# Patient Record
Sex: Male | Born: 1945 | Race: White | Hispanic: No | Marital: Married | Smoking: Former smoker
Health system: Southern US, Community
[De-identification: ages and names within clinical notes are randomized; demographics above are authoritative.]

## PROBLEM LIST (undated history)

## (undated) DIAGNOSIS — E119 Type 2 diabetes mellitus without complications: Secondary | ICD-10-CM

## (undated) DIAGNOSIS — F329 Major depressive disorder, single episode, unspecified: Secondary | ICD-10-CM

## (undated) DIAGNOSIS — R51 Headache: Secondary | ICD-10-CM

## (undated) DIAGNOSIS — K219 Gastro-esophageal reflux disease without esophagitis: Secondary | ICD-10-CM

## (undated) DIAGNOSIS — N529 Male erectile dysfunction, unspecified: Secondary | ICD-10-CM

## (undated) DIAGNOSIS — I2510 Atherosclerotic heart disease of native coronary artery without angina pectoris: Secondary | ICD-10-CM

## (undated) DIAGNOSIS — M47812 Spondylosis without myelopathy or radiculopathy, cervical region: Secondary | ICD-10-CM

## (undated) DIAGNOSIS — F32A Depression, unspecified: Secondary | ICD-10-CM

## (undated) DIAGNOSIS — E039 Hypothyroidism, unspecified: Secondary | ICD-10-CM

## (undated) DIAGNOSIS — E669 Obesity, unspecified: Secondary | ICD-10-CM

## (undated) DIAGNOSIS — G4733 Obstructive sleep apnea (adult) (pediatric): Secondary | ICD-10-CM

## (undated) DIAGNOSIS — G5600 Carpal tunnel syndrome, unspecified upper limb: Secondary | ICD-10-CM

## (undated) DIAGNOSIS — E785 Hyperlipidemia, unspecified: Secondary | ICD-10-CM

## (undated) HISTORY — DX: Hyperlipidemia, unspecified: E78.5

## (undated) HISTORY — DX: Carpal tunnel syndrome, unspecified upper limb: G56.00

## (undated) HISTORY — DX: Major depressive disorder, single episode, unspecified: F32.9

## (undated) HISTORY — DX: Spondylosis without myelopathy or radiculopathy, cervical region: M47.812

## (undated) HISTORY — DX: Atherosclerotic heart disease of native coronary artery without angina pectoris: I25.10

## (undated) HISTORY — PX: CATARACT EXTRACTION: SUR2

## (undated) HISTORY — DX: Obstructive sleep apnea (adult) (pediatric): G47.33

## (undated) HISTORY — DX: Gastro-esophageal reflux disease without esophagitis: K21.9

## (undated) HISTORY — DX: Male erectile dysfunction, unspecified: N52.9

## (undated) HISTORY — PX: CORONARY STENT PLACEMENT: SHX1402

## (undated) HISTORY — DX: Hypothyroidism, unspecified: E03.9

## (undated) HISTORY — DX: Obesity, unspecified: E66.9

## (undated) HISTORY — PX: LIPOMA RESECTION: SHX23

## (undated) HISTORY — PX: INGUINAL HERNIA REPAIR: SUR1180

## (undated) HISTORY — DX: Type 2 diabetes mellitus without complications: E11.9

## (undated) HISTORY — DX: Headache: R51

## (undated) HISTORY — PX: PENILE PROSTHESIS IMPLANT: SHX240

## (undated) HISTORY — DX: Depression, unspecified: F32.A

---

## 1998-05-19 ENCOUNTER — Ambulatory Visit (HOSPITAL_BASED_OUTPATIENT_CLINIC_OR_DEPARTMENT_OTHER): Admission: RE | Admit: 1998-05-19 | Discharge: 1998-05-19 | Payer: Self-pay | Admitting: Orthopedic Surgery

## 2003-07-29 ENCOUNTER — Ambulatory Visit (HOSPITAL_COMMUNITY): Admission: RE | Admit: 2003-07-29 | Discharge: 2003-07-29 | Payer: Self-pay | Admitting: Gastroenterology

## 2003-07-29 ENCOUNTER — Encounter (INDEPENDENT_AMBULATORY_CARE_PROVIDER_SITE_OTHER): Payer: Self-pay | Admitting: Specialist

## 2003-09-25 ENCOUNTER — Encounter: Admission: RE | Admit: 2003-09-25 | Discharge: 2003-12-24 | Payer: Self-pay | Admitting: Internal Medicine

## 2006-02-28 ENCOUNTER — Encounter: Admission: RE | Admit: 2006-02-28 | Discharge: 2006-02-28 | Payer: Self-pay | Admitting: Internal Medicine

## 2006-03-07 ENCOUNTER — Other Ambulatory Visit: Admission: RE | Admit: 2006-03-07 | Discharge: 2006-03-07 | Payer: Self-pay | Admitting: Interventional Radiology

## 2006-03-07 ENCOUNTER — Encounter (INDEPENDENT_AMBULATORY_CARE_PROVIDER_SITE_OTHER): Payer: Self-pay | Admitting: Specialist

## 2006-03-07 ENCOUNTER — Encounter: Admission: RE | Admit: 2006-03-07 | Discharge: 2006-03-07 | Payer: Self-pay | Admitting: Internal Medicine

## 2006-04-24 ENCOUNTER — Encounter (INDEPENDENT_AMBULATORY_CARE_PROVIDER_SITE_OTHER): Payer: Self-pay | Admitting: *Deleted

## 2006-04-24 ENCOUNTER — Ambulatory Visit (HOSPITAL_COMMUNITY): Admission: RE | Admit: 2006-04-24 | Discharge: 2006-04-25 | Payer: Self-pay | Admitting: General Surgery

## 2006-06-01 ENCOUNTER — Encounter: Admission: RE | Admit: 2006-06-01 | Discharge: 2006-06-01 | Payer: Self-pay | Admitting: Internal Medicine

## 2006-07-18 ENCOUNTER — Encounter: Admission: RE | Admit: 2006-07-18 | Discharge: 2006-07-18 | Payer: Self-pay | Admitting: Internal Medicine

## 2006-09-04 IMAGING — CR DG CHEST 2V
2 series · 2 of 2 positions shown · non-contrast
Comparison: none

CLINICAL DATA: Thyroid nodule.  Preoperative respiratory evaluation.  
 CHEST - 2 VIEW:

[view not recorded (1 of 2)]
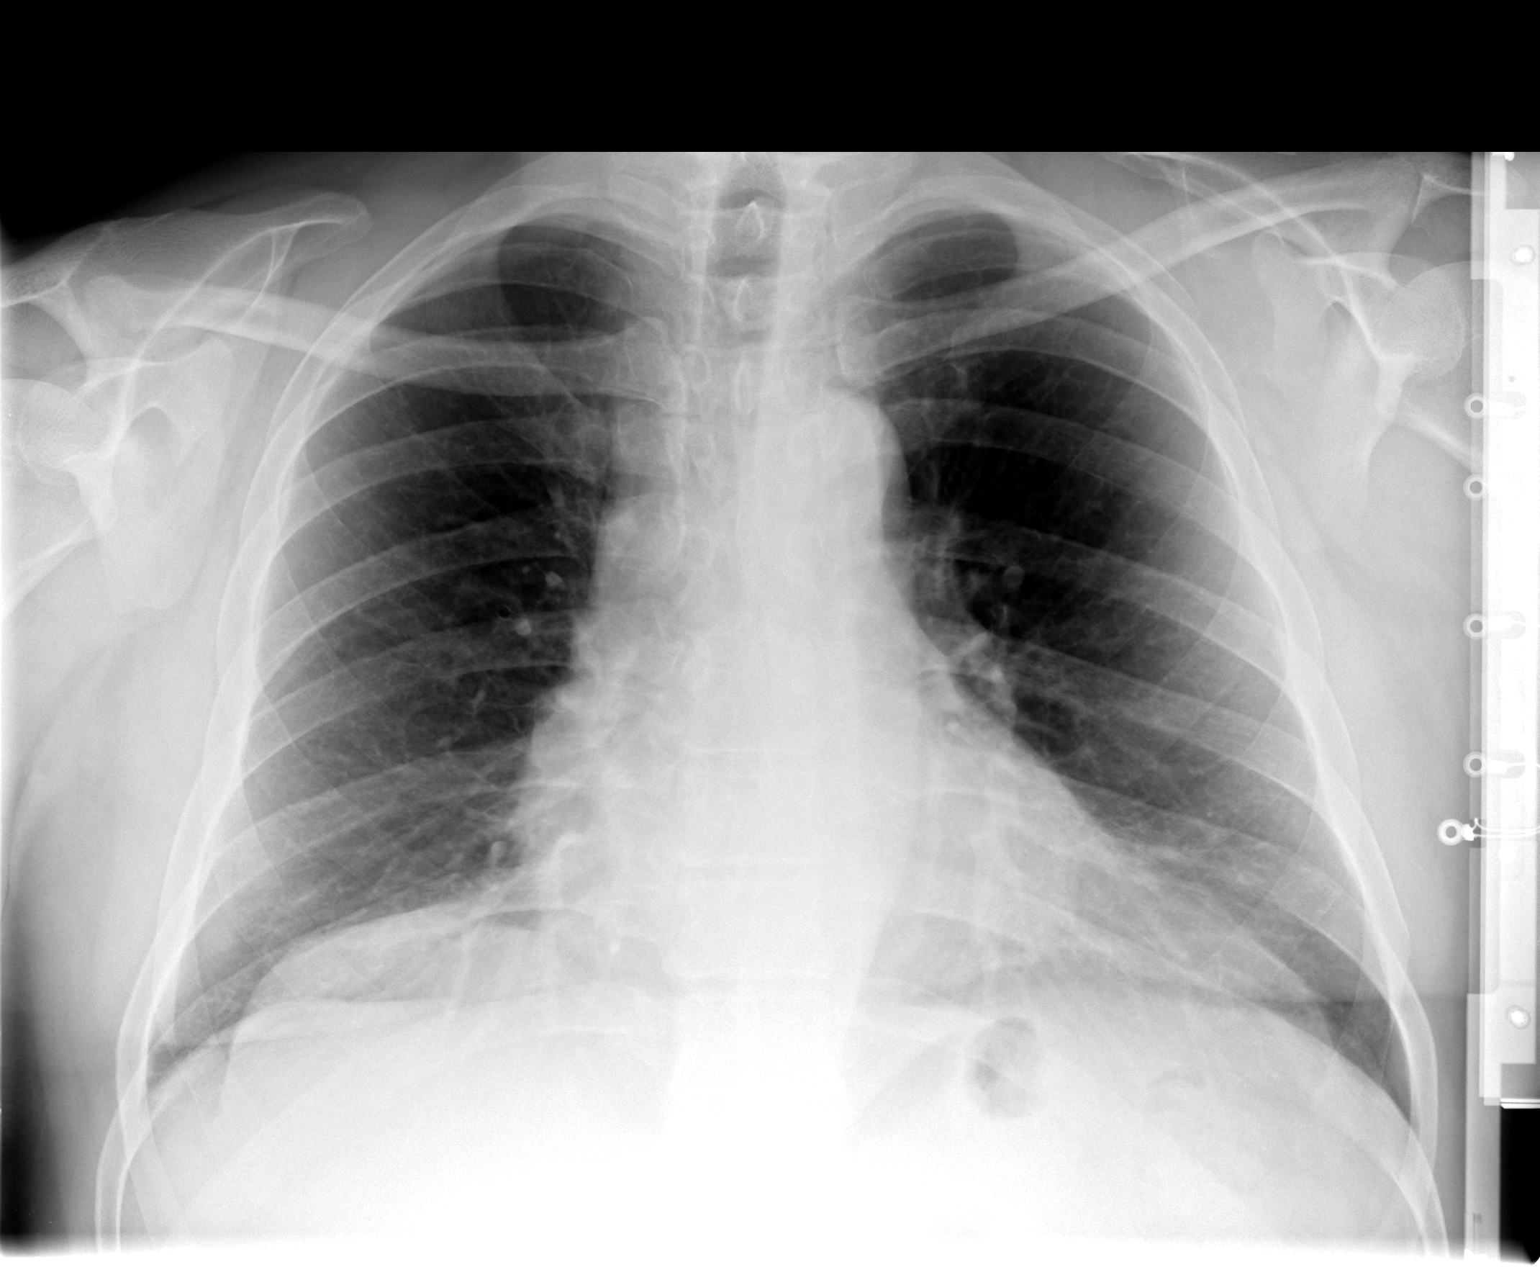

[view not recorded (2 of 2)]
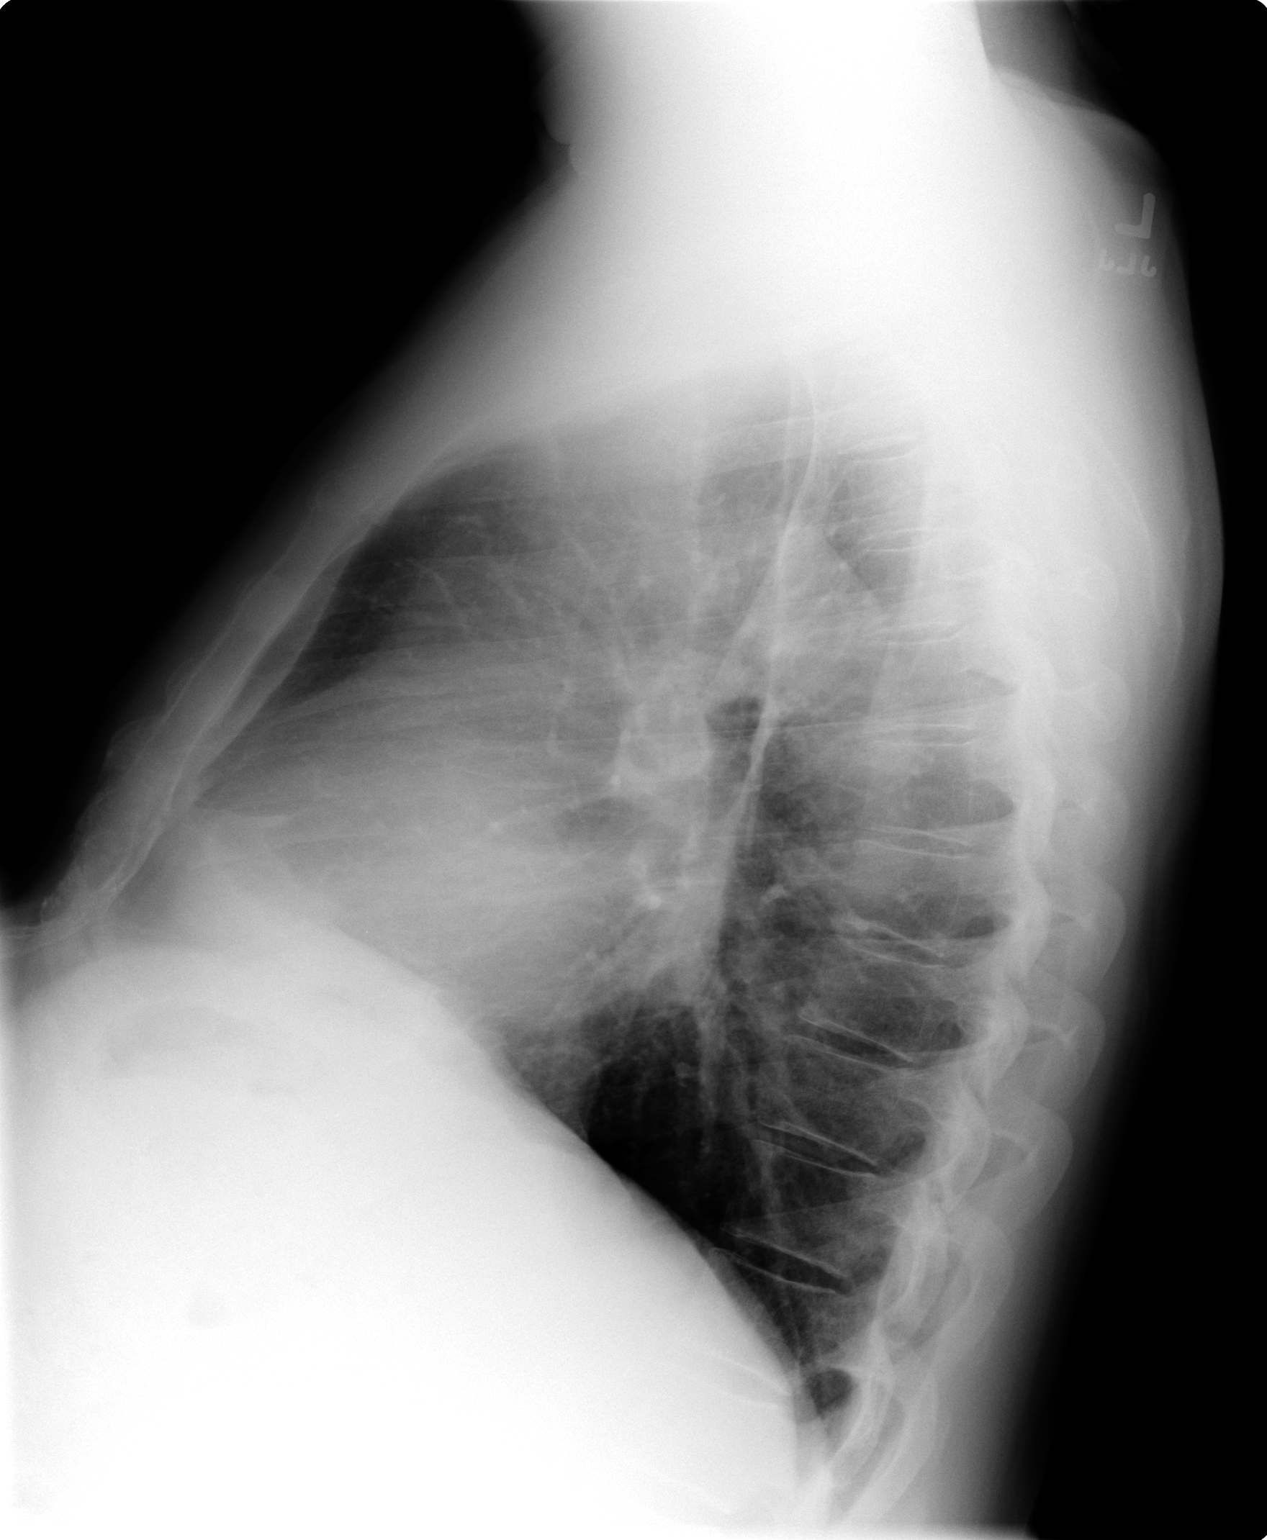

[2 of 2 positions shown; findings below may reference images not displayed]

FINDINGS: The heart and mediastinum are normal.  The lungs are clear.  No effusions.  No soft tissue or bony abnormalities.
IMPRESSION: Normal chest.

## 2006-12-22 ENCOUNTER — Encounter: Admission: RE | Admit: 2006-12-22 | Discharge: 2006-12-22 | Payer: Self-pay | Admitting: Internal Medicine

## 2007-01-09 ENCOUNTER — Encounter: Admission: RE | Admit: 2007-01-09 | Discharge: 2007-01-09 | Payer: Self-pay | Admitting: Internal Medicine

## 2007-03-20 ENCOUNTER — Encounter: Admission: RE | Admit: 2007-03-20 | Discharge: 2007-03-20 | Payer: Self-pay | Admitting: Internal Medicine

## 2008-12-22 ENCOUNTER — Ambulatory Visit (HOSPITAL_COMMUNITY): Admission: RE | Admit: 2008-12-22 | Discharge: 2008-12-22 | Payer: Self-pay | Admitting: General Surgery

## 2010-01-11 ENCOUNTER — Encounter: Admission: RE | Admit: 2010-01-11 | Discharge: 2010-02-01 | Payer: Self-pay | Admitting: Otolaryngology

## 2010-03-05 ENCOUNTER — Ambulatory Visit (HOSPITAL_BASED_OUTPATIENT_CLINIC_OR_DEPARTMENT_OTHER): Admission: RE | Admit: 2010-03-05 | Discharge: 2010-03-05 | Payer: Self-pay | Admitting: Urology

## 2010-09-02 ENCOUNTER — Ambulatory Visit: Payer: Self-pay | Admitting: Vascular Surgery

## 2010-09-30 ENCOUNTER — Ambulatory Visit
Admission: RE | Admit: 2010-09-30 | Discharge: 2010-09-30 | Payer: Self-pay | Source: Home / Self Care | Attending: Vascular Surgery | Admitting: Vascular Surgery

## 2010-12-08 ENCOUNTER — Other Ambulatory Visit: Payer: Self-pay | Admitting: Internal Medicine

## 2010-12-08 DIAGNOSIS — C73 Malignant neoplasm of thyroid gland: Secondary | ICD-10-CM

## 2010-12-13 LAB — POCT I-STAT 4, (NA,K, GLUC, HGB,HCT)
Glucose, Bld: 158 mg/dL — ABNORMAL HIGH (ref 70–99)
HCT: 44 % (ref 39.0–52.0)
Hemoglobin: 15 g/dL (ref 13.0–17.0)
Potassium: 3.7 mEq/L (ref 3.5–5.1)
Sodium: 141 mEq/L (ref 135–145)

## 2010-12-13 LAB — GLUCOSE, CAPILLARY: Glucose-Capillary: 129 mg/dL — ABNORMAL HIGH (ref 70–99)

## 2010-12-20 ENCOUNTER — Encounter (HOSPITAL_COMMUNITY)
Admission: RE | Admit: 2010-12-20 | Discharge: 2010-12-20 | Disposition: A | Discharge status: Home / Self Care | Payer: 59 | Source: Ambulatory Visit | Attending: Internal Medicine | Admitting: Internal Medicine

## 2010-12-20 DIAGNOSIS — Z09 Encounter for follow-up examination after completed treatment for conditions other than malignant neoplasm: Secondary | ICD-10-CM | POA: Insufficient documentation

## 2010-12-20 DIAGNOSIS — C73 Malignant neoplasm of thyroid gland: Secondary | ICD-10-CM

## 2010-12-20 DIAGNOSIS — Z85048 Personal history of other malignant neoplasm of rectum, rectosigmoid junction, and anus: Secondary | ICD-10-CM | POA: Insufficient documentation

## 2010-12-20 DIAGNOSIS — Z85850 Personal history of malignant neoplasm of thyroid: Secondary | ICD-10-CM | POA: Insufficient documentation

## 2010-12-21 ENCOUNTER — Encounter (HOSPITAL_COMMUNITY)
Admission: RE | Admit: 2010-12-21 | Discharge: 2010-12-21 | Disposition: A | Discharge status: Home / Self Care | Payer: 59 | Source: Ambulatory Visit | Attending: Internal Medicine | Admitting: Internal Medicine

## 2010-12-21 DIAGNOSIS — Z09 Encounter for follow-up examination after completed treatment for conditions other than malignant neoplasm: Secondary | ICD-10-CM | POA: Insufficient documentation

## 2010-12-21 DIAGNOSIS — Z85850 Personal history of malignant neoplasm of thyroid: Secondary | ICD-10-CM | POA: Insufficient documentation

## 2010-12-22 ENCOUNTER — Encounter (HOSPITAL_COMMUNITY)
Admission: RE | Admit: 2010-12-22 | Discharge: 2010-12-22 | Disposition: A | Discharge status: Home / Self Care | Payer: 59 | Source: Ambulatory Visit | Attending: Internal Medicine | Admitting: Internal Medicine

## 2010-12-24 ENCOUNTER — Encounter (HOSPITAL_COMMUNITY)
Admission: RE | Admit: 2010-12-24 | Discharge: 2010-12-24 | Disposition: A | Discharge status: Home / Self Care | Payer: 59 | Source: Ambulatory Visit | Attending: Internal Medicine | Admitting: Internal Medicine

## 2010-12-24 MED ORDER — SODIUM IODIDE I 131 CAPSULE
4.0000 | Freq: Once | INTRAVENOUS | Status: AC | PRN
  Administered 2010-12-24: 4 via ORAL

## 2010-12-27 ENCOUNTER — Ambulatory Visit (HOSPITAL_COMMUNITY): Payer: Self-pay

## 2010-12-28 ENCOUNTER — Ambulatory Visit (HOSPITAL_COMMUNITY): Payer: Self-pay

## 2010-12-29 ENCOUNTER — Ambulatory Visit (HOSPITAL_COMMUNITY): Payer: Self-pay

## 2010-12-31 ENCOUNTER — Encounter (HOSPITAL_COMMUNITY): Payer: Self-pay

## 2011-01-06 LAB — COMPREHENSIVE METABOLIC PANEL
ALT: 21 U/L (ref 0–53)
AST: 25 U/L (ref 0–37)
Alkaline Phosphatase: 58 U/L (ref 39–117)
CO2: 32 mEq/L (ref 19–32)
Chloride: 101 mEq/L (ref 96–112)
Creatinine, Ser: 0.91 mg/dL (ref 0.4–1.5)
GFR calc Af Amer: >60 mL/min (ref >60)
GFR calc non Af Amer: >60 mL/min (ref >60)
Potassium: 4.1 mEq/L (ref 3.5–5.1)
Total Bilirubin: 1.5 mg/dL — ABNORMAL HIGH (ref 0.3–1.2)

## 2011-01-06 LAB — DIFFERENTIAL
Basophils Absolute: 0.1 10*3/uL (ref 0.0–0.1)
Eosinophils Absolute: 0.1 10*3/uL (ref 0.0–0.7)
Lymphocytes Relative: 26 % (ref 12–46)
Lymphs Abs: 1.5 10*3/uL (ref 0.7–4.0)
Monocytes Absolute: 0.4 10*3/uL (ref 0.1–1.0)
Neutro Abs: 3.8 10*3/uL (ref 1.7–7.7)

## 2011-01-06 LAB — CBC
MCV: 98.1 fL (ref 78.0–100.0)
RBC: 4.67 MIL/uL (ref 4.22–5.81)
WBC: 5.9 10*3/uL (ref 4.0–10.5)

## 2011-01-06 LAB — URINALYSIS, ROUTINE W REFLEX MICROSCOPIC
Glucose, UA: NEGATIVE mg/dL
Hgb urine dipstick: NEGATIVE
Specific Gravity, Urine: 1.008 (ref 1.005–1.030)
Urobilinogen, UA: 1 mg/dL (ref 0.0–1.0)

## 2011-02-08 NOTE — Assessment & Plan Note (Signed)
OFFICE VISIT   Richard Ruiz, Richard Ruiz  DOB:  April 07, 1946                                       09/30/2010  CHART#:[id number]   CHIEF COMPLAINT:  Numbness in feet.   HISTORY OF PRESENT ILLNESS:  The patient is a 65 year old male referred  by Dr. 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  The patient states that he has some numbness and tingling that occurs in  his hands and feet.  This is chronic in nature.  He does not really  describe claudication symptoms.  He has had no trouble healing up wounds  on his feet currently.   CHRONIC MEDICAL PROBLEMS:  Diabetes, hypertension, elevated cholesterol,  fibromyalgia and neuropathy.  These are all currently controlled and  followed by Dr. Donette Larry.  He has had diabetes at least since 2000.   PAST SURGICAL HISTORY:  He has had multiple implants and revisions of a  penile implant.  The first one was done in 2006, second one done in  2007.  The last one was done in 2011.   SOCIAL HISTORY:  He is retired.  He served in [name] in the [employer].  He is married.  He has 3 children, only one of which remains living.  He  is a former smoker but quit in 1971.  He does not consume alcohol  regularly.   FAMILY HISTORY:  Not remarkable for vascular disease at age less than  81.   REVIEW OF SYSTEMS:  CONSTITUTIONAL:  He is 5 feet 7, 205 pounds.  VASCULAR:  As described above.  CARDIAC:  He has some occasional chest tightness, shortness of breath  with exertion.  GI:  He has a history of reflux.  NEUROLOGIC:  He has occasional dizziness, headaches.  PULMONARY:  Bronchitis and wheezing.  ENT:  He has had some decline in his eyesight and hearing loss.  MUSCULOSKELETAL:  He has multiple-joint arthritis pain.  PSYCHIATRIC:  He has mild depression, mild anxiety and mild attention  deficit disorder.   MEDICATIONS:  Centrum, calcium, zolpidem, levothyroxine, Cymbalta,  amlodipine, simvastatin, Lyrica, vitamin D  Osteo Bi-Flex, fish oil,  clonazepam, Tylenol Arthritis and topiramate.   ALLERGIES:  He has allergies listed to no medications.   PHYSICAL EXAMINATION:  Blood pressure is 151/89 in the left arm, heart  rate is 96 and regular, respirations 18.  HEENT:  Unremarkable.  Neck  has 2+ carotid pulses without bruit.  Chest is clear to auscultation  bilaterally.  Cardiac is regular rate and rhythm without murmur.  Abdomen is slightly obese, soft, nontender, nondistended.  No masses.  Extremities:  He has 2+ femoral, 2+ dorsalis pedis pulses bilaterally.  He does have hair loss from the mid tibial area down to the foot  bilaterally.  He has decreased sensation to light touch throughout the  entire sole and dorsal aspect of the foot as well as palms of hands and  digits tips.  Skin has no open ulcers or rashes except for a right knee  abrasion which is recent.  Neurologic exam is unremarkable other than  the numbness described above on sensory exam.  Musculoskeletal exam  shows no major joint deformities.   He had bilateral ABIs performed on September 02, 2010, which were  triphasic and normal bilaterally, greater than 1.   SUMMARY:  The patient has evidence of  extremity neuropathy in his upper  and lower extremities.  He did not have evidence of large-vessel disease  but certainly could have some small-vessel disease from his diabetes  that contributes to some of his numbness and tingling, although I would  also state that his symptoms certainly could be primary neuropathic in  nature secondary to the diabetes.  He does not require any intervention  for arterial occlusive disease at this point.  He will follow up with me  on an as-needed basis.     Janetta Hora. Fields, MD  Electronically Signed   CEF/MEDQ  D:  09/30/2010  T:  10/01/2010  Job:  [id number]   cc:   Veverly Fells. Vernie Ammons, M.D.  Bennye Alm Dr. Donette Larry

## 2011-02-08 NOTE — Op Note (Signed)
NAME:  Richard Ruiz, Richard Ruiz NO.:  [id number]   MEDICAL RECORD NO.:  [id number]          PATIENT TYPE:  AMB   LOCATION:  DAY                          FACILITY:  [facility]   PHYSICIAN:  Angelia Mould. Derrell Lolling, M.D.DATE OF BIRTH:  1946-09-19   DATE OF PROCEDURE:  DATE OF DISCHARGE:                               OPERATIVE REPORT   PREOPERATIVE DIAGNOSIS:  Right inguinal hernia.   POSTOPERATIVE DIAGNOSIS:  Right inguinal hernia.   OPERATION PERFORMED:  Open repair right inguinal hernia with UltraPro  mesh.   SURGEON:  Angelia Mould. Derrell Lolling, M.D.   ASSISTANT:  Dr. Ihor Gully.   OPERATIVE INDICATIONS:  This is a 65 year old Caucasian man who has had  an enlarging right inguinal hernia for about 2 years and it is beginning  to get more painful lately.  He also has a history of penile prosthesis  and pump inserted by Dr. Logan Bores in 1996.  The reservoir is just above the  symphysis pubis and the tubing does curve around on the right side.  I  have asked Dr. Ihor Gully to scrub in with me so that we could avoid  injury to the pump mechanism and tubing.   OPERATIVE FINDINGS:  The patient had a large indirect hernia with the  hernia sac extending down part way into the scrotum.  The sac was empty,  however, and did not contain any bowel or omentum.  He also had a large  lipoma associated with indirect sac.  There was no evidence of any other  hernia.  We could visualize the tubing medially near the pubic tubercle  and we could also palpate the reservoir above and behind the symphysis  pubis.  These structures were not injured at all.   OPERATIVE TECHNIQUE:  Following induction of general endotracheal  anesthesia, the patient's abdomen and genitalia were prepped and draped  in sterile fashion.  Intravenous antibiotics were given.  The patient  was identified as correct patient and correct procedure and correct  site.  A transverse, slightly oblique incision was made in the right  groin overlying the inguinal canal.  Dissection was carried down using  electrocautery through the subcutaneous tissue.  The external oblique  was identified.  The external ring was identified.  The external oblique  was incised in the direction of its fibers, opening up the external  ring.  The external oblique was dissected away from the underlying  structures.  There was a moderate scar tissue superior medially from the  previous surgery.  The ilioinguinal nerve was intimately associated with  cord structures.  This was separated and dissected back to its emergence  from the muscle laterally, clamped, divided and ligated with 2-0 silk  ties to prevent neuroma formation.  The cord structures were mobilized  and encircled with a Penrose drain.  We could identify the inguinal  ligament and pubic tubercle.  We could identify the tubing locations and  we were thus able to avoid injury to it.   I then dissected the cord structures.  There was a large lipoma  superiorly which was separated  easily from the cord, dissected all the  way back to the internal ring and then clamped, divided and ligated with  a 2-0 Vicryl ties.  He had very large indirect sac which was empty.  We  dissected this away from the vas deferens and the testicular vessels.  Once I had it dissected all the way back to the internal ring.  I opened  the sac up and looked inside and there was no contents.  I then inserted  my finger through the sac into the peritoneal space.  I could feel the  iliac vessels and medially I could feel the pump reservoir.  The  indirect sac was then twisted and suture ligated at the level of the  internal ring with suture ligature of 2-0 silk.  The redundant sac was  excised and discarded.  There wound was irrigated.  Hemostasis was  excellent.  We repaired the floor of the inguinal canal with a 3 inch x  6 inch piece of UltraPro polyester mesh.  Mesh was trimmed at the  corners to accommodate  the wound.  The mesh was sutured in place with  running sutures of 2-0 Prolene and interrupted mattress sutures of 2-0  Prolene.  The mesh was sutured so as to generously overlap the fascia at  the pubic tubercle, along the inguinal ligament inferiorly.  Medially  and superior medially we placed about six interrupted mattress sutures  of 2-0 Prolene.  It was during this part of procedure that we were very  careful to visualize and avoid injury to the pump tubing.  Superolaterally, a running suture of 2-0 Prolene was used.  The mesh was  incised laterally so as to wrap around the cord structures of the  internal ring.  The tails of the mesh were overlapped laterally and the  suture lines completed.  We placed two more sutures of Prolene in the  lateral slit of the mesh to tighten up the mesh around the internal  ring.  This provided excellent repair both medial and lateral to the  internal ring but allowed a fingertip opening for the cord structures.  The wound was irrigated with saline.  The repair was inspected.  Everything looked fine.  The external oblique was closed with running  suture of 2-0.  Counts were correct.      Angelia Mould. Derrell Lolling, M.D.  Electronically Signed     HMI/MEDQ  D:  12/22/2008  T:  12/22/2008  Job:  [id number]   cc:   Georgann Housekeeper, MD  Fax: [phone]   Veverly Fells. Vernie Ammons, M.D.  Fax: [phone]

## 2011-02-11 NOTE — Op Note (Signed)
   NAME:  FRANCISCA, HARBUCK NO.:  [id number]   MEDICAL RECORD NO.:  [id number]                   PATIENT TYPE:  AMB   LOCATION:  ENDO                                 FACILITY:  [facility]   PHYSICIAN:  Graylin Shiver, M.D.                DATE OF BIRTH:  18-Nov-1945   DATE OF PROCEDURE:  07/29/2003  DATE OF DISCHARGE:                                 OPERATIVE REPORT   PROCEDURE PERFORMED:  Colonoscopy.   INDICATIONS FOR PROCEDURE:  Screening.   Informed consent was obtained after explanation of the risks of bleeding,  infection, and perforation.   PREMEDICATIONS:  Fentanyl 75 mcg  IV, Versed 8 mg IV.   DESCRIPTION OF PROCEDURE:  With the patient in the left lateral decubitus  position, a rectal exam was performed and no masses were felt.  The Olympus  colonoscope was inserted into the rectum and advanced around the colon to  the cecum.  Cecal landmarks were identified.  The cecum and ascending colon  were normal.  In the mid transverse colon there was a small 3 mm sessile  polyp removed with cold forceps.  The descending colon looked normal.  In  the sigmoid there was a small 3 mm polyp removed with cold forceps.  The  rectum looked normal.  The patient tolerated the procedure well without  complications.   IMPRESSION:  Two small colon polyps.   PLAN:  The pathology will be checked.                                               Graylin Shiver, M.D.    Germain Osgood  D:  07/29/2003  T:  07/29/2003  Job:  [id number]

## 2011-02-11 NOTE — Op Note (Signed)
NAME:  Richard Ruiz, Richard Ruiz NO.:  [id number]   MEDICAL RECORD NO.:  [id number]          PATIENT TYPE:  AMB   LOCATION:  DAY                          FACILITY:  [facility]   PHYSICIAN:  Angelia Mould. Derrell Lolling, M.D.DATE OF BIRTH:  [birth year]   DATE OF PROCEDURE:  04/24/2006  DATE OF DISCHARGE:                                 OPERATIVE REPORT   PREOPERATIVE DIAGNOSIS:  Multinodular goiter.   POSTOPERATIVE DIAGNOSIS:  Multinodular goiter, suspect thyroiditis.   OPERATION PERFORMED:  Total thyroidectomy, frozen section.   SURGEON:  Angelia Mould. Derrell Lolling, M.D.   FIRST ASSISTANT:  Ollen Gross. Carolynne Edouard, M.D.   OPERATIVE INDICATIONS:  This is a 65 year old white man who was relatively  asymptomatic but has been treated for hypothyroidism for 15 years.  His dose  of Synthroid has been slowly increasing because of elevated TSH.  He had a  thyroid ultrasound recently which showed bilateral thyroid nodules.  The  large nodule on the right measured 2.1 cm but there was at least four solid  nodules.  Fine needle aspiration cytology of the large nodule the right  revealed a follicular lesion with Hurthle cells and lymphocytes.  This was  indeterminate for cancer and suspicious for thyroiditis.  He is brought to  operating room for thyroidectomy.   OPERATIVE FINDINGS:  The patient had a slightly enlarged thyroid gland with  bilateral firm, whitish nodules.  There was no sign of any cancer in that  there was no invasion of contiguous tissues and no enlarged lymph nodes.  The thyroid gland itself was quite vascular with increased amount of blood  vessels and also the dissection bilaterally was somewhat difficult because  of stickiness of the tissues consistent with chronic inflammation and  thyroiditis.  For this reason we kept the dissection in the capsule of the  thyroid gland throughout to avoid injuries to the parathyroid glands and the  recurrent laryngeal nerves.  We felt the dissection into the  tracheoesophageal groove would be very hazardous because of the chronic  inflammation.   OPERATIVE TECHNIQUE:  Following induction of general endotracheal  anesthesia, the patient's neck was extended, arms at the side and placed in  a slightly reverse Trendelenburg position.  The neck and lower and upper  chest were then prepped and draped in sterile fashion.  A curved transverse  incision was made about 2 cm above the suprasternal notch.  Dissection was  carried down through the subcutaneous tissue, through the platysma muscle.  Skin and platysma flaps were raised superiorly and inferiorly and self-  retaining retractor was placed.  One vein had to be divided, clamped and  ligated with 2-0 silk ties.  The strap muscles were divided the midline and  dissected off of the right and left thyroid lobes.  As mentioned above the  dissection was somewhat tedious because of the chronic inflammation.  We  dissected the left lobe of the thyroid gland first.  We mobilized the lower  pole by dividing several venous channels between metal clips.  We then took  the dissection up to the superior pole where  we isolated the superior pole  vessels.  I identified the superior parathyroid gland and felt that I  preserved that adequately.  The superior pole vessels were ligated in  continuity with 2-0 silk ties and a medium metal clip was placed above the  most superior tie for security and then the superior pole was divided.  We  were able to bring the superior pole down off of the larynx and cricoid  cartilage.  We then carefully dissected and the left thyroid lobe from  lateral to medial.  All the vascular channels were isolated between metal  clips and divided.  We stayed very close into the thyroid capsule.  There  was a large tubercle of thyroid gland on the left side which was carefully  dissected away from all the surrounding tissues.  It actually separated from  the thyroid gland more easily than I  thought would be necessary and so I  sent for frozen section and it simply looked like multinodular goiter with  thyroid tissue.  No cancer was seen on frozen section.  There was no nodule  that I could detect in that specimen.  There was no parathyroid tissue seen.  I did not specifically dissect out the recurrent laryngeal nerve on the left  because of the inflammation.  I did feel like we saw the inferior  parathyroid gland on the left and I felt like it was preserved.  We rotated  the thyroid gland up off of the anterior trachea.   I then turned my attention to the right thyroid lobe.  In like fashion I  slowly dissected all the areolar tissue off the lobe and mobilized it  medially.  Vascular channels of the inferior pole were divided between metal  clips and divided.  Superior pole vessels were carefully isolated.  I  identified the superior parathyroid gland on the right and at the end of the  case it looked like it had a good vascular supply and a normal color.  The  superior pole vessels were ligated in continuity with 2-0 silk ties and a  metal clip placed superiorly and then divided.  In like fashion, I mobilized  the thyroid gland from lateral to medial keeping the dissection in the plane  of the thyroid capsule.  Vascular channels were isolated and controlled  metal clips and divided.  We did not specifically dissected the recurrent  laryngeal nerve on the right either but we felt like we stayed away from it.  Because of the technique of dissection and I felt that it would risk injury  to the nerve to dissect into the chronically inflamed tissues in the  tracheoesophageal groove.  We mobilized the thyroid up off of the trachea  and removed it.   During the case, we had had more bleeding than typical.  The bleeding had  been controlled with metal clips.  We irrigated out both sides and felt that  we had pretty good hemostasis.  We placed Surgicel gauze on both sides  and waited for over 5 minutes and the took a look and there was absolutely no  bleeding or swelling whatsoever.  We took the Surgicel out and it was dry.  We placed Surgicel gauze on both sides again and waited for another 5  minutes and there was no bleeding.  We then felt comfortable and closed the  strap muscles in the midline with interrupted sutures of 3-0 Vicryl.  We  then closed platysma muscle with  interrupted sutures of 3-0 Vicryl and skin  was closed with a few metal staples and Steri-Strips.  Clean bandages were  placed the patient taken recovery in stable condition.  Estimated blood loss  was 40-50 mL.   COMPLICATIONS:  None.   Sponge, needle and instrument counts were correct.      Angelia Mould. Derrell Lolling, M.D.  Electronically Signed     HMI/MEDQ  D:  04/24/2006  T:  04/24/2006  Job:  [id number]   cc:   Georgann Housekeeper, MD  Fax: [phone]

## 2011-09-15 ENCOUNTER — Ambulatory Visit (HOSPITAL_COMMUNITY): Payer: 59

## 2011-10-03 ENCOUNTER — Encounter (HOSPITAL_COMMUNITY)
Admission: RE | Admit: 2011-10-03 | Discharge: 2011-10-03 | Disposition: A | Discharge status: Home / Self Care | Payer: Non-veteran care | Source: Ambulatory Visit | Attending: Cardiology | Admitting: Cardiology

## 2011-10-03 DIAGNOSIS — Z5189 Encounter for other specified aftercare: Secondary | ICD-10-CM | POA: Insufficient documentation

## 2011-10-03 DIAGNOSIS — E785 Hyperlipidemia, unspecified: Secondary | ICD-10-CM | POA: Insufficient documentation

## 2011-10-03 DIAGNOSIS — E039 Hypothyroidism, unspecified: Secondary | ICD-10-CM | POA: Insufficient documentation

## 2011-10-03 DIAGNOSIS — I10 Essential (primary) hypertension: Secondary | ICD-10-CM | POA: Insufficient documentation

## 2011-10-03 DIAGNOSIS — G4730 Sleep apnea, unspecified: Secondary | ICD-10-CM | POA: Insufficient documentation

## 2011-10-03 DIAGNOSIS — K219 Gastro-esophageal reflux disease without esophagitis: Secondary | ICD-10-CM | POA: Insufficient documentation

## 2011-10-03 DIAGNOSIS — Z9861 Coronary angioplasty status: Secondary | ICD-10-CM | POA: Insufficient documentation

## 2011-10-03 NOTE — Progress Notes (Signed)
Pt started cardiac rehab today.  Pt tolerated light exercise without difficulty.VSS.  Normal sinus rhythm.  Pt denies cp or dyspnea with exercise.  Pt oriented to exercise equipment and routine.  Understanding  Verbalized.  Will continue to monitor the patient throughout  the program.-jr,rn

## 2011-10-05 ENCOUNTER — Ambulatory Visit (HOSPITAL_COMMUNITY)
Admission: RE | Admit: 2011-10-05 | Discharge: 2011-10-05 | Disposition: A | Discharge status: Home / Self Care | Payer: Non-veteran care | Source: Ambulatory Visit | Attending: Cardiology | Admitting: Cardiology

## 2011-10-05 ENCOUNTER — Encounter (HOSPITAL_COMMUNITY): Admission: RE | Admit: 2011-10-05 | Payer: Non-veteran care | Source: Ambulatory Visit

## 2011-10-05 DIAGNOSIS — Z9861 Coronary angioplasty status: Secondary | ICD-10-CM | POA: Insufficient documentation

## 2011-10-05 DIAGNOSIS — I10 Essential (primary) hypertension: Secondary | ICD-10-CM | POA: Insufficient documentation

## 2011-10-05 DIAGNOSIS — E039 Hypothyroidism, unspecified: Secondary | ICD-10-CM | POA: Insufficient documentation

## 2011-10-05 DIAGNOSIS — E785 Hyperlipidemia, unspecified: Secondary | ICD-10-CM | POA: Insufficient documentation

## 2011-10-05 DIAGNOSIS — K219 Gastro-esophageal reflux disease without esophagitis: Secondary | ICD-10-CM | POA: Insufficient documentation

## 2011-10-05 DIAGNOSIS — Z5189 Encounter for other specified aftercare: Secondary | ICD-10-CM | POA: Insufficient documentation

## 2011-10-05 DIAGNOSIS — G4730 Sleep apnea, unspecified: Secondary | ICD-10-CM | POA: Insufficient documentation

## 2011-10-05 LAB — GLUCOSE, CAPILLARY: Glucose-Capillary: 146 mg/dL — ABNORMAL HIGH (ref 70–99)

## 2011-10-05 NOTE — Progress Notes (Signed)
Richard Ruiz 66 y.o. male       Nutrition Screen                                                                    YES  NO Do you live in a nursing home?  X   Do you eat out more than 3 times/week?   X  If yes, how many times per week do you eat out?  Do you have food allergies?   X If yes, what are you allergic to?  Have you gained or lost more than 10 lbs without trying?               X If yes, how much weight have you lost and over what time period?  lbs gained or lost over  weeks/month  Do you want to lose weight?    X  If yes, what is a goal weight or amount of weight you would like to lose?  Do you eat alone most of the time?   X   Do you eat less than 2 meals/day?  X If yes, how many meals do you eat?  Do you drink more than 3 alcohol drinks/day?  X If yes, how many drinks per day?  Are you having trouble with constipation? *  X If yes, what are you doing to help relieve constipation?  Do you have financial difficulties with buying food?*    X   Are you experiencing regular nausea/ vomiting?*     X   Do you have a poor appetite? *                                        X   Do you have trouble chewing/swallowing? *   X    Pt with diagnoses of:  X Stent/ PTCA X GERD          X Dyslipidemia  / HDL< 40 / LDL>70 / High TG      X %  Body fat >goal / Body Mass Index >25 X HTN / BP >120/80 X DM/A1c >6 / CBG >126       Pt Risk Score   2       Diagnosis Risk Score  75       Total Risk Score   77                        X High Risk                Low Risk              HT: 67"  WT: 206.6 lb (93.9 kg)   IBW 67.3 140%IBW BMI 32.4 37.6%body fat   Meds reviewed: MVI, vit D, osteobiflex triple strength  PMH: sleep apnea/CPAP, Hypothyroid s/p thyroidectomy, peripheral neuropathy      Activity level: Pt is moderately active  Wt goal: 182-194 lb ( 82.7-88.2 kg) Current tobacco use? No      Food/Drug Interaction? No Labs:  Per MD note: A1C 5.7, LDL 48 07/26/11 Glucose 126    LDL goal:   <  70      DM,  and > 2: HTN, > 64 yo male Estimated Daily Nutrition Needs for: ? wt loss  1550-2050 Kcal , Total Fat 40-55gm, Saturated Fat 11-16 gm, Trans Fat 1.5-2.0 gm,  Sodium less than 1500 mg, Grams of CHO 175-250

## 2011-10-07 ENCOUNTER — Ambulatory Visit (HOSPITAL_COMMUNITY)
Admission: RE | Admit: 2011-10-07 | Discharge: 2011-10-07 | Disposition: A | Discharge status: Home / Self Care | Payer: Non-veteran care | Source: Ambulatory Visit | Attending: Cardiology | Admitting: Cardiology

## 2011-10-07 ENCOUNTER — Encounter (HOSPITAL_COMMUNITY): Payer: Non-veteran care

## 2011-10-07 DIAGNOSIS — Z9861 Coronary angioplasty status: Secondary | ICD-10-CM | POA: Insufficient documentation

## 2011-10-07 DIAGNOSIS — K219 Gastro-esophageal reflux disease without esophagitis: Secondary | ICD-10-CM | POA: Insufficient documentation

## 2011-10-07 DIAGNOSIS — Z5189 Encounter for other specified aftercare: Secondary | ICD-10-CM | POA: Insufficient documentation

## 2011-10-07 DIAGNOSIS — E039 Hypothyroidism, unspecified: Secondary | ICD-10-CM | POA: Insufficient documentation

## 2011-10-07 DIAGNOSIS — I10 Essential (primary) hypertension: Secondary | ICD-10-CM | POA: Insufficient documentation

## 2011-10-07 DIAGNOSIS — E785 Hyperlipidemia, unspecified: Secondary | ICD-10-CM | POA: Insufficient documentation

## 2011-10-07 DIAGNOSIS — G4730 Sleep apnea, unspecified: Secondary | ICD-10-CM | POA: Insufficient documentation

## 2011-10-07 LAB — GLUCOSE, CAPILLARY: Glucose-Capillary: 177 mg/dL — ABNORMAL HIGH (ref 70–99)

## 2011-10-10 ENCOUNTER — Encounter (HOSPITAL_COMMUNITY): Payer: Non-veteran care

## 2011-10-10 ENCOUNTER — Encounter (HOSPITAL_COMMUNITY)
Admission: RE | Admit: 2011-10-10 | Discharge: 2011-10-10 | Disposition: A | Discharge status: Home / Self Care | Payer: Non-veteran care | Source: Ambulatory Visit | Attending: Cardiology | Admitting: Cardiology

## 2011-10-10 LAB — GLUCOSE, CAPILLARY
Glucose-Capillary: 172 mg/dL — ABNORMAL HIGH (ref 70–99)
Glucose-Capillary: 121 mg/dL — ABNORMAL HIGH (ref 70–99)

## 2011-10-12 ENCOUNTER — Encounter (HOSPITAL_COMMUNITY): Payer: Non-veteran care

## 2011-10-12 ENCOUNTER — Encounter (HOSPITAL_COMMUNITY)
Admission: RE | Admit: 2011-10-12 | Discharge: 2011-10-12 | Disposition: A | Discharge status: Home / Self Care | Payer: Non-veteran care | Source: Ambulatory Visit | Attending: Cardiology | Admitting: Cardiology

## 2011-10-14 ENCOUNTER — Encounter (HOSPITAL_COMMUNITY): Payer: Non-veteran care

## 2011-10-14 ENCOUNTER — Encounter (HOSPITAL_COMMUNITY)
Admission: RE | Admit: 2011-10-14 | Discharge: 2011-10-14 | Disposition: A | Discharge status: Home / Self Care | Payer: Non-veteran care | Source: Ambulatory Visit | Attending: Cardiology | Admitting: Cardiology

## 2011-10-17 ENCOUNTER — Encounter (HOSPITAL_COMMUNITY): Payer: Non-veteran care

## 2011-10-17 ENCOUNTER — Encounter (HOSPITAL_COMMUNITY)
Admission: RE | Admit: 2011-10-17 | Discharge: 2011-10-17 | Disposition: A | Discharge status: Home / Self Care | Payer: Non-veteran care | Source: Ambulatory Visit | Attending: Cardiology | Admitting: Cardiology

## 2011-10-19 ENCOUNTER — Encounter (HOSPITAL_COMMUNITY)
Admission: RE | Admit: 2011-10-19 | Discharge: 2011-10-19 | Disposition: A | Discharge status: Home / Self Care | Payer: Non-veteran care | Source: Ambulatory Visit | Attending: Cardiology | Admitting: Cardiology

## 2011-10-19 ENCOUNTER — Encounter (HOSPITAL_COMMUNITY): Payer: Non-veteran care

## 2011-10-21 ENCOUNTER — Encounter (HOSPITAL_COMMUNITY)
Admission: RE | Admit: 2011-10-21 | Discharge: 2011-10-21 | Disposition: A | Discharge status: Home / Self Care | Payer: Non-veteran care | Source: Ambulatory Visit | Attending: Cardiology | Admitting: Cardiology

## 2011-10-21 ENCOUNTER — Encounter (HOSPITAL_COMMUNITY): Payer: Non-veteran care

## 2011-10-21 NOTE — Progress Notes (Signed)
Reviewed home exercise with pt today.  Pt plans to exercise at health club using aerobic equipment and low-wt, high-repetition resistance training on machines for exercise.  Reminded pt not to use heavy resistance or engage Valsalva maneuver while exercising. Reviewed THR, pulse, RPE, sign and symptoms, NTG use, and when to call 911 or MD.  Pt voiced understanding. Harriett Sine [facility] 10/21/11 1201

## 2011-10-24 ENCOUNTER — Encounter (HOSPITAL_COMMUNITY)
Admission: RE | Admit: 2011-10-24 | Discharge: 2011-10-24 | Disposition: A | Discharge status: Home / Self Care | Payer: Non-veteran care | Source: Ambulatory Visit | Attending: Cardiology | Admitting: Cardiology

## 2011-10-24 ENCOUNTER — Encounter (HOSPITAL_COMMUNITY): Payer: Non-veteran care

## 2011-10-24 NOTE — Progress Notes (Signed)
Richard Ruiz 66 y.o. male Nutrition Note  Spoke with pt.  Nutrition Plan and Nutrition Survey reviewed with pt.  Pt is follwing a step 2 heart healthy diet. This Clinical research associate went over Diabetes Education test results. Pt is currently a diet-controlled diabetic. Weight loss tips discussed.  Pt expressed understanding.  Nutrition Diagnosis   Food-and nutrition-related knowledge deficit related to lack of exposure to information as related to diagnosis of: ? CVD ? DM (A1c 5.7)    Obesity related to excessive energy intake as evidenced by a BMI of 32.4  Nutrition RX/ Estimated Daily Nutrition Needs for: wt loss  1550-2050 Kcal , Total Fat 40-55gm, Saturated Fat 11-16 gm, Trans Fat 1.5-2.0 gm,  Sodium less than 1500 mg, Grams of CHO 175-250    Nutrition Intervention   Pt's individual nutrition plan including cholesterol goals reviewed with pt.   Benefits of adopting Therapeutic Lifestyle Changes discussed when Medficts reviewed.   Pt to attend the Portion Distortion class   Pt to attend the  ? Nutrition I class                    ? Nutrition II class        ? Diabetes Blitz class - met 10/04/11       ? Diabetes Q & A class - met   Pt given handouts for: ? wt loss ? DM    Continue client-centered nutrition education by RD, as part of interdisciplinary care. Goal(s)   Pt to identify food quantities necessary to achieve: ? wt loss ? gain to a goal wt of 182-194 lb (82.7-88.2 kg) at graduation from cardiac rehab.  Monitor and Evaluate progress toward nutrition goal with team.

## 2011-10-26 ENCOUNTER — Encounter (HOSPITAL_COMMUNITY)
Admission: RE | Admit: 2011-10-26 | Discharge: 2011-10-26 | Disposition: A | Discharge status: Home / Self Care | Payer: Non-veteran care | Source: Ambulatory Visit | Attending: Cardiology | Admitting: Cardiology

## 2011-10-26 ENCOUNTER — Encounter (HOSPITAL_COMMUNITY): Payer: Non-veteran care

## 2011-10-28 ENCOUNTER — Encounter (HOSPITAL_COMMUNITY): Payer: Non-veteran care

## 2011-10-28 ENCOUNTER — Encounter (HOSPITAL_COMMUNITY)
Admission: RE | Admit: 2011-10-28 | Discharge: 2011-10-28 | Disposition: A | Discharge status: Home / Self Care | Payer: Non-veteran care | Source: Ambulatory Visit | Attending: Cardiology | Admitting: Cardiology

## 2011-10-28 DIAGNOSIS — G4730 Sleep apnea, unspecified: Secondary | ICD-10-CM | POA: Insufficient documentation

## 2011-10-28 DIAGNOSIS — E785 Hyperlipidemia, unspecified: Secondary | ICD-10-CM | POA: Insufficient documentation

## 2011-10-28 DIAGNOSIS — Z9861 Coronary angioplasty status: Secondary | ICD-10-CM | POA: Insufficient documentation

## 2011-10-28 DIAGNOSIS — Z5189 Encounter for other specified aftercare: Secondary | ICD-10-CM | POA: Insufficient documentation

## 2011-10-28 DIAGNOSIS — K219 Gastro-esophageal reflux disease without esophagitis: Secondary | ICD-10-CM | POA: Insufficient documentation

## 2011-10-28 DIAGNOSIS — E039 Hypothyroidism, unspecified: Secondary | ICD-10-CM | POA: Insufficient documentation

## 2011-10-28 DIAGNOSIS — I10 Essential (primary) hypertension: Secondary | ICD-10-CM | POA: Insufficient documentation

## 2011-10-31 ENCOUNTER — Encounter (HOSPITAL_COMMUNITY)
Admission: RE | Admit: 2011-10-31 | Discharge: 2011-10-31 | Disposition: A | Discharge status: Home / Self Care | Payer: Non-veteran care | Source: Ambulatory Visit | Attending: Cardiology | Admitting: Cardiology

## 2011-10-31 ENCOUNTER — Encounter (HOSPITAL_COMMUNITY): Payer: Non-veteran care

## 2011-11-02 ENCOUNTER — Encounter (HOSPITAL_COMMUNITY)
Admission: RE | Admit: 2011-11-02 | Discharge: 2011-11-02 | Disposition: A | Discharge status: Home / Self Care | Payer: Non-veteran care | Source: Ambulatory Visit | Attending: Cardiology | Admitting: Cardiology

## 2011-11-02 ENCOUNTER — Encounter (HOSPITAL_COMMUNITY): Payer: Non-veteran care

## 2011-11-04 ENCOUNTER — Encounter (HOSPITAL_COMMUNITY)
Admission: RE | Admit: 2011-11-04 | Discharge: 2011-11-04 | Disposition: A | Discharge status: Home / Self Care | Payer: Non-veteran care | Source: Ambulatory Visit | Attending: Cardiology | Admitting: Cardiology

## 2011-11-04 ENCOUNTER — Encounter (HOSPITAL_COMMUNITY): Payer: Non-veteran care

## 2011-11-07 ENCOUNTER — Encounter (HOSPITAL_COMMUNITY): Payer: Non-veteran care

## 2011-11-07 ENCOUNTER — Encounter (HOSPITAL_COMMUNITY)
Admission: RE | Admit: 2011-11-07 | Discharge: 2011-11-07 | Disposition: A | Discharge status: Home / Self Care | Payer: Non-veteran care | Source: Ambulatory Visit | Attending: Cardiology | Admitting: Cardiology

## 2011-11-09 ENCOUNTER — Encounter (HOSPITAL_COMMUNITY)
Admission: RE | Admit: 2011-11-09 | Discharge: 2011-11-09 | Disposition: A | Discharge status: Home / Self Care | Payer: Non-veteran care | Source: Ambulatory Visit | Attending: Cardiology | Admitting: Cardiology

## 2011-11-09 ENCOUNTER — Encounter (HOSPITAL_COMMUNITY): Payer: Non-veteran care

## 2011-11-11 ENCOUNTER — Encounter (HOSPITAL_COMMUNITY): Payer: Non-veteran care

## 2011-11-11 ENCOUNTER — Encounter (HOSPITAL_COMMUNITY)
Admission: RE | Admit: 2011-11-11 | Discharge: 2011-11-11 | Disposition: A | Discharge status: Home / Self Care | Payer: Non-veteran care | Source: Ambulatory Visit | Attending: Cardiology | Admitting: Cardiology

## 2011-11-14 ENCOUNTER — Encounter (HOSPITAL_COMMUNITY)
Admission: RE | Admit: 2011-11-14 | Discharge: 2011-11-14 | Disposition: A | Discharge status: Home / Self Care | Payer: Non-veteran care | Source: Ambulatory Visit | Attending: Cardiology | Admitting: Cardiology

## 2011-11-14 ENCOUNTER — Encounter (HOSPITAL_COMMUNITY): Payer: Non-veteran care

## 2011-11-16 ENCOUNTER — Encounter (HOSPITAL_COMMUNITY)
Admission: RE | Admit: 2011-11-16 | Discharge: 2011-11-16 | Disposition: A | Discharge status: Home / Self Care | Payer: Non-veteran care | Source: Ambulatory Visit | Attending: Cardiology | Admitting: Cardiology

## 2011-11-16 ENCOUNTER — Encounter (HOSPITAL_COMMUNITY): Payer: Non-veteran care

## 2011-11-18 ENCOUNTER — Encounter (HOSPITAL_COMMUNITY): Payer: Non-veteran care

## 2011-11-18 ENCOUNTER — Encounter (HOSPITAL_COMMUNITY)
Admission: RE | Admit: 2011-11-18 | Discharge: 2011-11-18 | Disposition: A | Discharge status: Home / Self Care | Payer: Non-veteran care | Source: Ambulatory Visit | Attending: Cardiology | Admitting: Cardiology

## 2011-11-21 ENCOUNTER — Encounter (HOSPITAL_COMMUNITY)
Admission: RE | Admit: 2011-11-21 | Discharge: 2011-11-21 | Disposition: A | Discharge status: Home / Self Care | Payer: Non-veteran care | Source: Ambulatory Visit | Attending: Cardiology | Admitting: Cardiology

## 2011-11-21 ENCOUNTER — Encounter (HOSPITAL_COMMUNITY): Payer: Non-veteran care

## 2011-11-23 ENCOUNTER — Encounter (HOSPITAL_COMMUNITY)
Admission: RE | Admit: 2011-11-23 | Discharge: 2011-11-23 | Disposition: A | Discharge status: Home / Self Care | Payer: Non-veteran care | Source: Ambulatory Visit | Attending: Cardiology | Admitting: Cardiology

## 2011-11-23 ENCOUNTER — Encounter (HOSPITAL_COMMUNITY): Payer: Non-veteran care

## 2011-11-25 ENCOUNTER — Encounter (HOSPITAL_COMMUNITY): Payer: Non-veteran care

## 2011-11-25 ENCOUNTER — Encounter (HOSPITAL_COMMUNITY)
Admission: RE | Admit: 2011-11-25 | Discharge: 2011-11-25 | Disposition: A | Discharge status: Home / Self Care | Payer: Non-veteran care | Source: Ambulatory Visit | Attending: Cardiology | Admitting: Cardiology

## 2011-11-25 DIAGNOSIS — I10 Essential (primary) hypertension: Secondary | ICD-10-CM | POA: Insufficient documentation

## 2011-11-25 DIAGNOSIS — K219 Gastro-esophageal reflux disease without esophagitis: Secondary | ICD-10-CM | POA: Insufficient documentation

## 2011-11-25 DIAGNOSIS — Z5189 Encounter for other specified aftercare: Secondary | ICD-10-CM | POA: Insufficient documentation

## 2011-11-25 DIAGNOSIS — G4730 Sleep apnea, unspecified: Secondary | ICD-10-CM | POA: Insufficient documentation

## 2011-11-25 DIAGNOSIS — E039 Hypothyroidism, unspecified: Secondary | ICD-10-CM | POA: Insufficient documentation

## 2011-11-25 DIAGNOSIS — E785 Hyperlipidemia, unspecified: Secondary | ICD-10-CM | POA: Insufficient documentation

## 2011-11-25 DIAGNOSIS — Z9861 Coronary angioplasty status: Secondary | ICD-10-CM | POA: Insufficient documentation

## 2011-11-28 ENCOUNTER — Encounter (HOSPITAL_COMMUNITY)
Admission: RE | Admit: 2011-11-28 | Discharge: 2011-11-28 | Disposition: A | Discharge status: Home / Self Care | Payer: Non-veteran care | Source: Ambulatory Visit | Attending: Cardiology | Admitting: Cardiology

## 2011-11-28 ENCOUNTER — Encounter (HOSPITAL_COMMUNITY): Payer: Non-veteran care

## 2011-11-30 ENCOUNTER — Encounter (HOSPITAL_COMMUNITY)
Admission: RE | Admit: 2011-11-30 | Discharge: 2011-11-30 | Disposition: A | Discharge status: Home / Self Care | Payer: Non-veteran care | Source: Ambulatory Visit | Attending: Cardiology | Admitting: Cardiology

## 2011-11-30 ENCOUNTER — Encounter (HOSPITAL_COMMUNITY): Payer: Non-veteran care

## 2011-12-02 ENCOUNTER — Encounter (HOSPITAL_COMMUNITY): Payer: Non-veteran care

## 2011-12-02 ENCOUNTER — Encounter (HOSPITAL_COMMUNITY)
Admission: RE | Admit: 2011-12-02 | Discharge: 2011-12-02 | Disposition: A | Discharge status: Home / Self Care | Payer: Non-veteran care | Source: Ambulatory Visit | Attending: Cardiology | Admitting: Cardiology

## 2011-12-05 ENCOUNTER — Encounter (HOSPITAL_COMMUNITY)
Admission: RE | Admit: 2011-12-05 | Discharge: 2011-12-05 | Disposition: A | Discharge status: Home / Self Care | Payer: Non-veteran care | Source: Ambulatory Visit | Attending: Cardiology | Admitting: Cardiology

## 2011-12-05 ENCOUNTER — Encounter (HOSPITAL_COMMUNITY): Payer: Non-veteran care

## 2011-12-07 ENCOUNTER — Encounter (HOSPITAL_COMMUNITY)
Admission: RE | Admit: 2011-12-07 | Discharge: 2011-12-07 | Disposition: A | Discharge status: Home / Self Care | Payer: Non-veteran care | Source: Ambulatory Visit | Attending: Cardiology | Admitting: Cardiology

## 2011-12-07 ENCOUNTER — Encounter (HOSPITAL_COMMUNITY): Payer: Non-veteran care

## 2011-12-09 ENCOUNTER — Encounter (HOSPITAL_COMMUNITY)
Admission: RE | Admit: 2011-12-09 | Discharge: 2011-12-09 | Disposition: A | Discharge status: Home / Self Care | Payer: Non-veteran care | Source: Ambulatory Visit | Attending: Cardiology | Admitting: Cardiology

## 2011-12-09 ENCOUNTER — Encounter (HOSPITAL_COMMUNITY): Payer: Non-veteran care

## 2011-12-12 ENCOUNTER — Encounter (HOSPITAL_COMMUNITY)
Admission: RE | Admit: 2011-12-12 | Discharge: 2011-12-12 | Disposition: A | Discharge status: Home / Self Care | Payer: Non-veteran care | Source: Ambulatory Visit | Attending: Cardiology | Admitting: Cardiology

## 2011-12-12 ENCOUNTER — Encounter (HOSPITAL_COMMUNITY): Payer: Non-veteran care

## 2011-12-14 ENCOUNTER — Encounter (HOSPITAL_COMMUNITY)
Admission: RE | Admit: 2011-12-14 | Discharge: 2011-12-14 | Disposition: A | Discharge status: Home / Self Care | Payer: Non-veteran care | Source: Ambulatory Visit | Attending: Cardiology | Admitting: Cardiology

## 2011-12-14 ENCOUNTER — Encounter (HOSPITAL_COMMUNITY): Payer: Non-veteran care

## 2011-12-16 ENCOUNTER — Encounter (HOSPITAL_COMMUNITY): Payer: Non-veteran care

## 2011-12-16 ENCOUNTER — Encounter (HOSPITAL_COMMUNITY)
Admission: RE | Admit: 2011-12-16 | Discharge: 2011-12-16 | Disposition: A | Discharge status: Home / Self Care | Payer: Non-veteran care | Source: Ambulatory Visit | Attending: Cardiology | Admitting: Cardiology

## 2011-12-19 ENCOUNTER — Encounter (HOSPITAL_COMMUNITY): Payer: Non-veteran care

## 2011-12-19 ENCOUNTER — Encounter (HOSPITAL_COMMUNITY)
Admission: RE | Admit: 2011-12-19 | Discharge: 2011-12-19 | Disposition: A | Discharge status: Home / Self Care | Payer: Non-veteran care | Source: Ambulatory Visit | Attending: Cardiology | Admitting: Cardiology

## 2011-12-21 ENCOUNTER — Encounter (HOSPITAL_COMMUNITY): Payer: Self-pay

## 2011-12-21 ENCOUNTER — Encounter (HOSPITAL_COMMUNITY)
Admission: RE | Admit: 2011-12-21 | Discharge: 2011-12-21 | Disposition: A | Discharge status: Home / Self Care | Payer: Non-veteran care | Source: Ambulatory Visit | Attending: Cardiology | Admitting: Cardiology

## 2011-12-21 ENCOUNTER — Encounter (HOSPITAL_COMMUNITY): Payer: Non-veteran care

## 2011-12-23 ENCOUNTER — Encounter (HOSPITAL_COMMUNITY): Payer: Non-veteran care

## 2011-12-23 ENCOUNTER — Encounter (HOSPITAL_COMMUNITY)
Admission: RE | Admit: 2011-12-23 | Discharge: 2011-12-23 | Disposition: A | Discharge status: Home / Self Care | Payer: Non-veteran care | Source: Ambulatory Visit | Attending: Cardiology | Admitting: Cardiology

## 2011-12-23 NOTE — Progress Notes (Signed)
Cardiac Rehabilitation Program Outcomes Report   Orientation:  09/15/2011 Graduate Date:  12/23/2011 Discharge Date:   # of sessions completed: 36  Cardiologist: Carmelia Bake MD:  Ralene Muskrat Time:  60  A.  Exercise Program:  Tolerates exercise @ 4.1 METS for 30 minutes, Improved functional capacity  13.92% %, Improved  muscular strength  8.11 %, Improved  flexibility 21.74 % and Discharged to home exercise program.  Anticipated compliance:  fair  B.  Mental Health:  Good mental attitude and Quality of Life (QOL)  improvements:  Overall  43.12 %, Health/Functioning 52.41 %, Socioeconomics 38.94 %, Psych/Spiritual 44.86 %, Family 11.52 %    C.  Education/Instruction/Skills  Accurately checks own pulse, Knows THR for exercise, Uses Perceived Exertion Scale and Attended 14 education classes    D.  Nutrition/Weight Control/Body Composition:  % Body Fat  37.6 and Patient has gained 1.6 kg BMI 33.0   E.  Blood Lipids    No results found for this basename: CHOL, HDL, LDLCALC, LDLDIRECT, TRIG, CHOLHDL    F.  Lifestyle Changes:    G.  Symptoms noted with exercise:  Resting hypertension and Exertional hypertension  Report Completed By:  Electronically signed by Harriett Sine MS on Friday December 23 2011 at 1558   Comments:   Pt successfully graduated from cardiac rehab program completing 36/36 sessions.  Pt had excellent participation in exercise and education classes.  Pt made positive lifestyle changes and should be commended for his efforts.  Pt plans to exercise on his own at local gym.   Pt VSS, telemetry- normal sinus rhythm.  Pt did not have hospital admission during cardiac rehab program.

## 2011-12-26 ENCOUNTER — Encounter (HOSPITAL_COMMUNITY): Payer: Non-veteran care

## 2011-12-28 ENCOUNTER — Encounter (HOSPITAL_COMMUNITY): Payer: Non-veteran care

## 2011-12-30 ENCOUNTER — Encounter (HOSPITAL_COMMUNITY): Payer: Non-veteran care

## 2012-01-02 ENCOUNTER — Encounter (HOSPITAL_COMMUNITY): Payer: Non-veteran care

## 2012-01-02 NOTE — Progress Notes (Signed)
Addendum to Nutrition Section of Cardiac Rehab Program Progress Report  Pt following a step 2 Therapeutic Lifestyle Changes diet. Pt wt up 1.6 kg,  No change in % body fat.

## 2012-01-04 ENCOUNTER — Encounter (HOSPITAL_COMMUNITY): Payer: Non-veteran care

## 2012-01-06 ENCOUNTER — Encounter (HOSPITAL_COMMUNITY): Payer: Non-veteran care

## 2012-10-18 DIAGNOSIS — I10 Essential (primary) hypertension: Secondary | ICD-10-CM | POA: Diagnosis not present

## 2012-10-18 DIAGNOSIS — G609 Hereditary and idiopathic neuropathy, unspecified: Secondary | ICD-10-CM | POA: Diagnosis not present

## 2012-10-18 DIAGNOSIS — Z1331 Encounter for screening for depression: Secondary | ICD-10-CM | POA: Diagnosis not present

## 2012-10-18 DIAGNOSIS — E119 Type 2 diabetes mellitus without complications: Secondary | ICD-10-CM | POA: Diagnosis not present

## 2012-10-18 DIAGNOSIS — F325 Major depressive disorder, single episode, in full remission: Secondary | ICD-10-CM | POA: Diagnosis not present

## 2012-10-18 DIAGNOSIS — I2510 Atherosclerotic heart disease of native coronary artery without angina pectoris: Secondary | ICD-10-CM | POA: Diagnosis not present

## 2012-10-18 DIAGNOSIS — N529 Male erectile dysfunction, unspecified: Secondary | ICD-10-CM | POA: Diagnosis not present

## 2012-10-18 DIAGNOSIS — E039 Hypothyroidism, unspecified: Secondary | ICD-10-CM | POA: Diagnosis not present

## 2012-10-18 DIAGNOSIS — Z0000 Encounter for general adult medical examination without abnormal findings: Secondary | ICD-10-CM | POA: Diagnosis not present

## 2012-10-18 DIAGNOSIS — E782 Mixed hyperlipidemia: Secondary | ICD-10-CM | POA: Diagnosis not present

## 2012-10-22 DIAGNOSIS — I10 Essential (primary) hypertension: Secondary | ICD-10-CM | POA: Diagnosis not present

## 2012-10-22 DIAGNOSIS — E039 Hypothyroidism, unspecified: Secondary | ICD-10-CM | POA: Diagnosis not present

## 2012-10-31 DIAGNOSIS — R42 Dizziness and giddiness: Secondary | ICD-10-CM | POA: Diagnosis not present

## 2012-11-19 DIAGNOSIS — J069 Acute upper respiratory infection, unspecified: Secondary | ICD-10-CM | POA: Diagnosis not present

## 2012-11-20 DIAGNOSIS — H9390 Unspecified disorder of ear, unspecified ear: Secondary | ICD-10-CM | POA: Diagnosis not present

## 2012-11-20 DIAGNOSIS — E119 Type 2 diabetes mellitus without complications: Secondary | ICD-10-CM | POA: Diagnosis not present

## 2012-11-20 DIAGNOSIS — G43109 Migraine with aura, not intractable, without status migrainosus: Secondary | ICD-10-CM | POA: Diagnosis not present

## 2012-12-19 DIAGNOSIS — H905 Unspecified sensorineural hearing loss: Secondary | ICD-10-CM | POA: Diagnosis not present

## 2012-12-19 DIAGNOSIS — J329 Chronic sinusitis, unspecified: Secondary | ICD-10-CM | POA: Diagnosis not present

## 2012-12-19 DIAGNOSIS — H903 Sensorineural hearing loss, bilateral: Secondary | ICD-10-CM | POA: Diagnosis not present

## 2013-01-11 ENCOUNTER — Other Ambulatory Visit: Payer: Self-pay

## 2013-01-11 MED ORDER — ARMODAFINIL 150 MG PO TABS: 150.0000 mg | ORAL_TABLET | Freq: Every day | ORAL | Status: DC

## 2013-01-11 NOTE — Telephone Encounter (Signed)
Former [facility] patient requesting a refill.  Has not been assigned to a new provider.  Forwarding request to Dr Eulah Citizen

## 2013-01-21 DIAGNOSIS — I10 Essential (primary) hypertension: Secondary | ICD-10-CM | POA: Diagnosis not present

## 2013-01-21 DIAGNOSIS — E039 Hypothyroidism, unspecified: Secondary | ICD-10-CM | POA: Diagnosis not present

## 2013-02-28 ENCOUNTER — Encounter: Payer: Self-pay | Admitting: Nurse Practitioner

## 2013-02-28 ENCOUNTER — Ambulatory Visit (INDEPENDENT_AMBULATORY_CARE_PROVIDER_SITE_OTHER): Payer: Medicare Other | Admitting: Nurse Practitioner

## 2013-02-28 VITALS — BP 130/83 | HR 65 | Ht 67.0 in | Wt 218.0 lb

## 2013-02-28 DIAGNOSIS — E1142 Type 2 diabetes mellitus with diabetic polyneuropathy: Secondary | ICD-10-CM | POA: Diagnosis not present

## 2013-02-28 DIAGNOSIS — R269 Unspecified abnormalities of gait and mobility: Secondary | ICD-10-CM | POA: Diagnosis not present

## 2013-02-28 DIAGNOSIS — E119 Type 2 diabetes mellitus without complications: Secondary | ICD-10-CM | POA: Insufficient documentation

## 2013-02-28 DIAGNOSIS — G4733 Obstructive sleep apnea (adult) (pediatric): Secondary | ICD-10-CM | POA: Diagnosis not present

## 2013-02-28 DIAGNOSIS — E1149 Type 2 diabetes mellitus with other diabetic neurological complication: Secondary | ICD-10-CM

## 2013-02-28 MED ORDER — PREGABALIN 75 MG PO CAPS: 75.0000 mg | ORAL_CAPSULE | Freq: Three times a day (TID) | ORAL | Status: DC

## 2013-02-28 NOTE — Progress Notes (Signed)
I have read the note, and I agree with the clinical assessment and plan.  

## 2013-02-28 NOTE — Progress Notes (Signed)
HPI: Patient returns for followup after last visit with Dr. Sandria Manly 09/06/2012. History of multiple problems to include daily headache, history of vertigo, diabetic neuropathy, fibromyalgia, obstructive sleep apnea with CPAP, essential tremor, cervical degenerative disease and coronary artery disease. He has a seven-year history of dizziness worse when looking up or down he has been evaluated by an audiologist who did not find any evidence of vestibular abnormality. MRI study of the brain December 2012 showed a few nonspecific ventricular white matter hyperintensities and mild generalized cortical atrophy. MRI of the brain showed no significant stenosis. He claims he uses a CPAP every night which  has improved his daytime sleepiness. He did not bring the disc to be downloaded  today. He continues to complain of daily mild headache, recently seen at the [place] and hypertensive medicines adjusted. CBGs average 164 -184. He claims he is walking 20 minutes every day.No new neurologic complaints. He has failed Topamax and Depakote for his headaches   ROS:  Gait abnormality, headache  Physical Exam General: well developed, well nourished, seated, in no evident distress Head: head normocephalic and atraumatic. Oropharynx benign Neck: supple with no carotid  bruits Cardiovascular: regular rate and rhythm, no murmurs  Neurologic Exam Mental Status: Awake and fully alert. Oriented to place and time. Follows all commands. Speech and language normal.   Cranial Nerves:  Pupils equal, briskly reactive to light. Extraocular movements full without nystagmus. Visual fields full to confrontation. Hearing intact and symmetric to finger snap. Facial sensation intact. Face, tongue, palate move normally and symmetrically. Neck flexion and extension normal.  Motor: Normal bulk and tone. Normal strength in all tested extremity muscles.No focal weakness Sensory.: Decreased vibratory to ankles decreased pinprick to mid shin ,intact to  touch .  Coordination: Rapid alternating movements normal in all extremities. Finger-to-nose performed  accurately bilaterally. Gait and Station: Arises from chair without difficulty. Stance is normal. Gait demonstrates normal stride length and balance . Able to heel, toe and unsteady  With tandem walk..  Reflexes: 2+ and symmetric. Toes downgoing.     ASSESSMENT: Headaches, diabetes, diabetic neuropathy, obstructive sleep apnea     PLAN: Will increase Lyrica to 3 times a day 75 mg. Will renew Encouraged to walk 50 minutes 6 days a week Keep blood sugars in good control Monitor  blood pressure keep systolic less than 130 Followup in 6 months  Nilda Riggs, GNP-BC APRN

## 2013-02-28 NOTE — Patient Instructions (Addendum)
Will increase Lyrica to 3 times a day 75 mg. Will renew Encouraged walk 50 minutes 6 days a week Keep blood sugars in good control Monitor  blood pressure keep systolic less than 130 Followup in 6 months

## 2013-03-16 ENCOUNTER — Encounter: Payer: Self-pay | Admitting: Neurology

## 2013-04-22 DIAGNOSIS — K219 Gastro-esophageal reflux disease without esophagitis: Secondary | ICD-10-CM | POA: Diagnosis not present

## 2013-04-22 DIAGNOSIS — G609 Hereditary and idiopathic neuropathy, unspecified: Secondary | ICD-10-CM | POA: Diagnosis not present

## 2013-04-22 DIAGNOSIS — E1149 Type 2 diabetes mellitus with other diabetic neurological complication: Secondary | ICD-10-CM | POA: Diagnosis not present

## 2013-04-22 DIAGNOSIS — F325 Major depressive disorder, single episode, in full remission: Secondary | ICD-10-CM | POA: Diagnosis not present

## 2013-04-22 DIAGNOSIS — I10 Essential (primary) hypertension: Secondary | ICD-10-CM | POA: Diagnosis not present

## 2013-04-22 DIAGNOSIS — E782 Mixed hyperlipidemia: Secondary | ICD-10-CM | POA: Diagnosis not present

## 2013-04-22 DIAGNOSIS — E039 Hypothyroidism, unspecified: Secondary | ICD-10-CM | POA: Diagnosis not present

## 2013-05-20 DIAGNOSIS — E039 Hypothyroidism, unspecified: Secondary | ICD-10-CM | POA: Diagnosis not present

## 2013-05-20 DIAGNOSIS — I2510 Atherosclerotic heart disease of native coronary artery without angina pectoris: Secondary | ICD-10-CM | POA: Diagnosis not present

## 2013-05-20 DIAGNOSIS — I10 Essential (primary) hypertension: Secondary | ICD-10-CM | POA: Diagnosis not present

## 2013-06-14 DIAGNOSIS — B079 Viral wart, unspecified: Secondary | ICD-10-CM | POA: Diagnosis not present

## 2013-06-18 ENCOUNTER — Telehealth: Payer: Self-pay | Admitting: Neurology

## 2013-06-18 NOTE — Telephone Encounter (Signed)
The wife for this patient was recently seen in the office. The patient has requested to see me in revisit, was a patient of Dr. Sandria Manly. I'll get a revisit set up.

## 2013-06-28 ENCOUNTER — Encounter: Payer: Self-pay | Admitting: Neurology

## 2013-07-03 ENCOUNTER — Encounter: Payer: Self-pay | Admitting: Neurology

## 2013-07-15 ENCOUNTER — Encounter: Payer: Self-pay | Admitting: Neurology

## 2013-07-15 ENCOUNTER — Ambulatory Visit (INDEPENDENT_AMBULATORY_CARE_PROVIDER_SITE_OTHER): Payer: Medicare Other | Admitting: Neurology

## 2013-07-15 VITALS — BP 130/74 | HR 81 | Wt 228.0 lb

## 2013-07-15 DIAGNOSIS — E1142 Type 2 diabetes mellitus with diabetic polyneuropathy: Secondary | ICD-10-CM

## 2013-07-15 DIAGNOSIS — R519 Headache, unspecified: Secondary | ICD-10-CM | POA: Insufficient documentation

## 2013-07-15 DIAGNOSIS — R269 Unspecified abnormalities of gait and mobility: Secondary | ICD-10-CM

## 2013-07-15 DIAGNOSIS — R51 Headache: Secondary | ICD-10-CM

## 2013-07-15 HISTORY — DX: Headache: R51

## 2013-07-15 MED ORDER — PREGABALIN 100 MG PO CAPS: 100.0000 mg | ORAL_CAPSULE | Freq: Three times a day (TID) | ORAL | Status: DC

## 2013-07-15 NOTE — Progress Notes (Signed)
Reason for visit: Peripheral neuropathy  Richard Ruiz is an 67 y.o. male  History of present illness:  Richard Ruiz is a 67 year old left-handed white male with a history of diabetes, and a diabetic peripheral neuropathy. The patient has had ongoing discomfort in the feet that is present at all times, worse in the evenings. The patient is on Cymbalta for depression, but he is not getting significant pain relief of his neuropathy on this medication. The patient also takes Lyrica at 75 mg 3 times daily. The patient is tolerating the medication, but it is not effective for his neuropathy pain. The patient does have some mild gait instability, and he will fall on occasion. The patient does not use any assistive device for walking. If she is particularly active one day, the patient will "pay for it" the next day. The patient also has a 15 year history of chronic daily headaches, better in the morning and worse as the day goes on. The headaches are throughout the entire head, and occasional pressure sensation in the frontal area. The patient also has some chronic neck discomfort, but he indicates that the headaches do not come up from the back of the head or neck. The patient was told he had some cervical arthritis. The patient comes to this office for an evaluation.  Past Medical History  Diagnosis Date  . Carpal tunnel syndrome     bilateral  . Cervical spondylosis without myelopathy   . Diabetes mellitus without complication   . GERD (gastroesophageal reflux disease)   . OSA (obstructive sleep apnea)     on CPAP  . CAD (coronary artery disease)   . Obesity   . Depression   . Hypothyroid   . Headache(784.0) 07/15/2013  . Dyslipidemia   . Impotence     Past Surgical History  Procedure Laterality Date  . Penile prosthesis implant    . Cataract extraction Left   . Inguinal hernia repair Right   . Lipoma resection Left     neck  . Coronary stent placement      History reviewed. No  pertinent family history.  Social history:  reports that he quit smoking about 44 years ago. He has never used smokeless tobacco. He reports that he does not drink alcohol or use illicit drugs.    Allergies  Allergen Reactions  . Codeine Itching  . Gabapentin     Jittery    Medications:  Current Outpatient Prescriptions on File Prior to Visit  Medication Sig Dispense Refill  . acetaminophen (TYLENOL) 325 MG tablet Take 325 mg by mouth every 6 (six) hours as needed.        Marland Kitchen aspirin 81 MG tablet Take 81 mg by mouth daily.        . cholecalciferol (VITAMIN D) 1000 UNITS tablet Take 1,000 Units by mouth daily.        . DULoxetine (CYMBALTA) 60 MG capsule Take 60 mg by mouth daily.        . fluticasone (FLONASE) 50 MCG/ACT nasal spray       . levothyroxine (SYNTHROID, LEVOTHROID) 150 MCG tablet Take 0.175 mcg by mouth daily.       . metFORMIN (GLUCOPHAGE) 500 MG tablet 500 mg 2 (two) times daily.      . metoprolol succinate (TOPROL-XL) 25 MG 24 hr tablet Take 25 mg by mouth daily.        . Misc Natural Products (OSTEO BI-FLEX ADV TRIPLE ST PO) Take 1 tablet by  mouth 2 (two) times daily.        . Multiple Vitamin (MULTIVITAMIN) tablet Take 1 tablet by mouth daily.        . ranitidine (ZANTAC) 150 MG capsule       . zolpidem (AMBIEN) 10 MG tablet Take 10 mg by mouth at bedtime as needed.         No current facility-administered medications on file prior to visit.    ROS:  Out of a complete 14 system review of symptoms, the patient complains only of the following symptoms, and all other reviewed systems are negative.  Weight gain, fatigue Chest pain, swelling in the legs Hearing loss, ringing in the ears, spinning sensations, difficulty swallowing Rash, birthmarks, itching, moles Blurred vision, eye pain Shortness of breath, cough Impotence Easy bruising, easy bleeding Feeling hot, cold, increased thirst Joint pain, joint swelling, achy muscles Allergies, runny nose, skin  sensitivity Memory loss, confusion, headache, numbness, weakness, dizziness, tremor Depression, anxiety, disinterest in activities, suicidal thoughts, hallucinations Restless legs, sleepiness  Blood pressure 130/74, pulse 81, weight 228 lb (103.42 kg).  Physical Exam  General: The patient is alert and cooperative at the time of the examination. The patient is moderately obese.  Skin: No significant peripheral edema is noted.   Neurologic Exam  Cranial nerves: Facial symmetry is present. Speech is normal, no aphasia or dysarthria is noted. Extraocular movements are full. Visual fields are full.  Motor: The patient has good strength in all 4 extremities. The patient is able to walk on the toes and heels.  Coordination: The patient has good finger-nose-finger and heel-to-shin bilaterally.  Gait and station: The patient has a normal gait. Tandem gait is slightly unsteady. Romberg is negative. No drift is seen.  Reflexes: Deep tendon reflexes are symmetric, but are depressed.   Assessment/Plan:  1. Diabetic peripheral neuropathy  2. Chronic daily headaches, likely tension headache  The patient will be increased on the Lyrica taking 100 mg 3 times daily. The Cymbalta will be moved to the evening hours to help with the neuropathy pain. Both the Lyrica and the Cymbalta may be increased in dose to help the neuropathy. The patient will followup in 4-6 months.  Marlan Palau MD 07/15/2013 6:58 PM  [facility] [address] [place], [place] [address]  Phone [phone] Fax [phone]

## 2013-07-15 NOTE — Patient Instructions (Signed)
Take cymbalta at night

## 2013-07-16 DIAGNOSIS — J329 Chronic sinusitis, unspecified: Secondary | ICD-10-CM | POA: Diagnosis not present

## 2013-07-16 DIAGNOSIS — R42 Dizziness and giddiness: Secondary | ICD-10-CM | POA: Diagnosis not present

## 2013-07-16 DIAGNOSIS — H903 Sensorineural hearing loss, bilateral: Secondary | ICD-10-CM | POA: Diagnosis not present

## 2013-07-18 ENCOUNTER — Encounter: Payer: Self-pay | Admitting: Neurology

## 2013-08-30 ENCOUNTER — Ambulatory Visit: Payer: Medicare Other | Admitting: Nurse Practitioner

## 2013-09-16 ENCOUNTER — Ambulatory Visit (HOSPITAL_COMMUNITY)
Admission: RE | Admit: 2013-09-16 | Discharge: 2013-09-16 | Disposition: A | Discharge status: Home / Self Care | Payer: Medicare Other | Source: Ambulatory Visit | Attending: Internal Medicine | Admitting: Internal Medicine

## 2013-09-16 ENCOUNTER — Other Ambulatory Visit: Payer: Self-pay | Admitting: Internal Medicine

## 2013-09-16 DIAGNOSIS — I10 Essential (primary) hypertension: Secondary | ICD-10-CM | POA: Diagnosis not present

## 2013-09-16 DIAGNOSIS — R079 Chest pain, unspecified: Secondary | ICD-10-CM | POA: Diagnosis not present

## 2013-09-16 DIAGNOSIS — I2510 Atherosclerotic heart disease of native coronary artery without angina pectoris: Secondary | ICD-10-CM | POA: Insufficient documentation

## 2013-09-16 DIAGNOSIS — R059 Cough, unspecified: Secondary | ICD-10-CM

## 2013-09-16 DIAGNOSIS — E119 Type 2 diabetes mellitus without complications: Secondary | ICD-10-CM | POA: Diagnosis not present

## 2013-09-16 DIAGNOSIS — J40 Bronchitis, not specified as acute or chronic: Secondary | ICD-10-CM | POA: Diagnosis not present

## 2013-09-16 DIAGNOSIS — E039 Hypothyroidism, unspecified: Secondary | ICD-10-CM | POA: Diagnosis not present

## 2013-09-16 DIAGNOSIS — R072 Precordial pain: Secondary | ICD-10-CM | POA: Diagnosis not present

## 2013-09-16 DIAGNOSIS — R05 Cough: Secondary | ICD-10-CM

## 2013-10-10 DIAGNOSIS — R059 Cough, unspecified: Secondary | ICD-10-CM | POA: Diagnosis not present

## 2013-10-10 DIAGNOSIS — R05 Cough: Secondary | ICD-10-CM | POA: Diagnosis not present

## 2013-10-18 DIAGNOSIS — B079 Viral wart, unspecified: Secondary | ICD-10-CM | POA: Diagnosis not present

## 2013-10-24 DIAGNOSIS — E039 Hypothyroidism, unspecified: Secondary | ICD-10-CM | POA: Diagnosis not present

## 2013-10-24 DIAGNOSIS — E1149 Type 2 diabetes mellitus with other diabetic neurological complication: Secondary | ICD-10-CM | POA: Diagnosis not present

## 2013-10-24 DIAGNOSIS — Z1331 Encounter for screening for depression: Secondary | ICD-10-CM | POA: Diagnosis not present

## 2013-10-24 DIAGNOSIS — Z23 Encounter for immunization: Secondary | ICD-10-CM | POA: Diagnosis not present

## 2013-10-24 DIAGNOSIS — Z0000 Encounter for general adult medical examination without abnormal findings: Secondary | ICD-10-CM | POA: Diagnosis not present

## 2013-10-24 DIAGNOSIS — E782 Mixed hyperlipidemia: Secondary | ICD-10-CM | POA: Diagnosis not present

## 2013-10-24 DIAGNOSIS — K219 Gastro-esophageal reflux disease without esophagitis: Secondary | ICD-10-CM | POA: Diagnosis not present

## 2013-10-24 DIAGNOSIS — N400 Enlarged prostate without lower urinary tract symptoms: Secondary | ICD-10-CM | POA: Diagnosis not present

## 2013-10-24 DIAGNOSIS — G609 Hereditary and idiopathic neuropathy, unspecified: Secondary | ICD-10-CM | POA: Diagnosis not present

## 2013-10-24 DIAGNOSIS — I10 Essential (primary) hypertension: Secondary | ICD-10-CM | POA: Diagnosis not present

## 2013-10-29 DIAGNOSIS — IMO0001 Reserved for inherently not codable concepts without codable children: Secondary | ICD-10-CM | POA: Diagnosis not present

## 2013-10-29 DIAGNOSIS — I10 Essential (primary) hypertension: Secondary | ICD-10-CM | POA: Diagnosis not present

## 2013-10-29 DIAGNOSIS — E039 Hypothyroidism, unspecified: Secondary | ICD-10-CM | POA: Diagnosis not present

## 2013-11-28 DIAGNOSIS — I10 Essential (primary) hypertension: Secondary | ICD-10-CM | POA: Diagnosis not present

## 2013-11-28 DIAGNOSIS — E039 Hypothyroidism, unspecified: Secondary | ICD-10-CM | POA: Diagnosis not present

## 2013-12-20 ENCOUNTER — Encounter: Payer: Self-pay | Admitting: Neurology

## 2014-01-01 DIAGNOSIS — E039 Hypothyroidism, unspecified: Secondary | ICD-10-CM | POA: Diagnosis not present

## 2014-01-01 DIAGNOSIS — IMO0001 Reserved for inherently not codable concepts without codable children: Secondary | ICD-10-CM | POA: Diagnosis not present

## 2014-01-01 DIAGNOSIS — I10 Essential (primary) hypertension: Secondary | ICD-10-CM | POA: Diagnosis not present

## 2014-01-15 ENCOUNTER — Ambulatory Visit (INDEPENDENT_AMBULATORY_CARE_PROVIDER_SITE_OTHER): Payer: Medicare Other | Admitting: Neurology

## 2014-01-15 ENCOUNTER — Encounter: Payer: Self-pay | Admitting: Neurology

## 2014-01-15 ENCOUNTER — Encounter (INDEPENDENT_AMBULATORY_CARE_PROVIDER_SITE_OTHER): Payer: Self-pay

## 2014-01-15 VITALS — BP 136/82 | HR 68 | Wt 203.0 lb

## 2014-01-15 DIAGNOSIS — E1142 Type 2 diabetes mellitus with diabetic polyneuropathy: Secondary | ICD-10-CM | POA: Diagnosis not present

## 2014-01-15 DIAGNOSIS — R51 Headache: Secondary | ICD-10-CM | POA: Diagnosis not present

## 2014-01-15 MED ORDER — DULOXETINE HCL 30 MG PO CPEP: 30.0000 mg | ORAL_CAPSULE | Freq: Every morning | ORAL | Status: DC

## 2014-01-15 MED ORDER — DULOXETINE HCL 60 MG PO CPEP: 60.0000 mg | ORAL_CAPSULE | Freq: Two times a day (BID) | ORAL | Status: DC

## 2014-01-15 MED ORDER — PREGABALIN 100 MG PO CAPS: 100.0000 mg | ORAL_CAPSULE | Freq: Three times a day (TID) | ORAL | Status: DC

## 2014-01-15 NOTE — Patient Instructions (Addendum)
Take cymbalta 30mg  in the morning and 60mg  at night for two weeks. If tolerating then increase to 60 mg in the morning and 60 mg at night    Peripheral Neuropathy Peripheral neuropathy is a type of nerve damage. It affects nerves that carry signals between the spinal cord and other parts of the body. These are called peripheral nerves. With peripheral neuropathy, one nerve or a group of nerves may be damaged.  CAUSES  Many things can damage peripheral nerves. For some people with peripheral neuropathy, the cause is unknown. Some causes include:  Diabetes. This is the most common cause of peripheral neuropathy.  Injury to a nerve.  Pressure or stress on a nerve that lasts a long time.  Too little vitamin B. Alcoholism can lead to this.  Infections.  Autoimmune diseases, such as multiple sclerosis and systemic lupus erythematosus.  Inherited nerve diseases.  Some medicines, such as cancer drugs.  Toxic substances, such as lead and mercury.  Too little blood flowing to the legs.  Kidney disease.  Thyroid disease. SIGNS AND SYMPTOMS  Different people have different symptoms. The symptoms you have will depend on which of your nerves is damaged. Common symptoms include:  Loss of feeling (numbness) in the feet and hands.  Tingling in the feet and hands.  Pain that burns.  Very sensitive skin.  Weakness.  Not being able to move a part of the body (paralysis).  Muscle twitching.  Clumsiness or poor coordination.  Loss of balance.  Not being able to control your bladder.  Feeling dizzy.  Sexual problems. DIAGNOSIS  Peripheral neuropathy is a symptom, not a disease. Finding the cause of peripheral neuropathy can be hard. To figure that out, your health care provider will take a medical history and do a physical exam. A neurological exam will also be done. This involves checking things affected by your brain, spinal cord, and nerves (nervous system). For example, your  health care provider will check your reflexes, how you move, and what you can feel.  Other types of tests may also be ordered, such as:  Blood tests.  A test of the fluid in your spinal cord.  Imaging tests, such as CT scans or an MRI.  Electromyography (EMG). This test checks the nerves that control muscles.  Nerve conduction velocity tests. These tests check how fast messages pass through your nerves.  Nerve biopsy. A small piece of nerve is removed. It is then checked under a microscope. TREATMENT   Medicine is often used to treat peripheral neuropathy. Medicines may include:  Pain-relieving medicines. Prescription or over-the-counter medicine may be suggested.  Antiseizure medicine. This may be used for pain.  Antidepressants. These also may help ease pain from neuropathy.  Lidocaine. This is a numbing medicine. You might wear a patch or be given a shot.  Mexiletine. This medicine is typically used to help control irregular heart rhythms.  Surgery. Surgery may be needed to relieve pressure on a nerve or to destroy a nerve that is causing pain.  Physical therapy to help movement.  Assistive devices to help movement. HOME CARE INSTRUCTIONS   Only take over-the-counter or prescription medicines as directed by your health care provider. Follow the instructions carefully for any given medicines. Do not take any other medicines without first getting approval from your health care provider.  If you have diabetes, work closely with your health care provider to keep your blood sugar under control.  If you have numbness in your feet:  Check every day for signs of injury or infection. Watch for redness, warmth, and swelling.  Wear padded socks and comfortable shoes. These help protect your feet.  Do not do things that put pressure on your damaged nerve.  Do not smoke. Smoking keeps blood from getting to damaged nerves.  Avoid or limit alcohol. Too much alcohol can cause a  lack of B vitamins. These vitamins are needed for healthy nerves.  Develop a good support system. Coping with peripheral neuropathy can be stressful. Talk to a mental health specialist or join a support group if you are struggling.  Follow up with your health care provider as directed. SEEK MEDICAL CARE IF:   You have new signs or symptoms of peripheral neuropathy.  You are struggling emotionally from dealing with peripheral neuropathy.  You have a fever. SEEK IMMEDIATE MEDICAL CARE IF:   You have an injury or infection that is not healing.  You feel very dizzy or begin vomiting.  You have chest pain.  You have trouble breathing. Document Released: 09/02/2002 Document Revised: 05/25/2011 Document Reviewed: 05/20/2013 [facility] Patient Information 2014 [place].

## 2014-01-15 NOTE — Progress Notes (Signed)
PATIENT: Richard Ruiz DOB: 08-22-1946  REASON FOR VISIT: follow up HISTORY FROM: patient  HISTORY OF PRESENT ILLNESS: Richard Ruiz is a 68 year old left-handed white male with a history of diabetes, and a diabetic peripheral neuropathy. He is returning today for follow-up. The patient is taking Cymbalta and Lyrica for neuropathy pain. At the last visit the Lyrica was increased and the Cymbalta was moved to the evening. The patient reports that the pain has improved some. He is unsure if moving the Cymbalta to the evening helped any. He complains that he is still restless at night but he has been using an OTC topical cream that has offered some relief. He fell yesterday going up the steps, states he missed the bottom step and that caused him to fall. No injuries was sustained. The patient does not use an assistive device when ambulating. He also has chronic daily headaches, this has remained the same since the last visit. Moving lights, such as headlights, or the sun reflecting on the car bothers his eyes and causes the headache and dizziness. When he gets headaches he will always have blurred vision, double vision and speech difficulty. He takes extra strength tylenol-- approximately  9 pills a day and that offers some benefit. The patient also has a history of BPPV, reports that he thinks its more frequent now. He did get to therapy for BPPV, but after three sessions the therapist felt that it was making him worse and advised him to stop.   REVIEW OF SYSTEMS: Full 14 system review of systems performed and notable only for:  Constitutional: N/A  Eyes: eye itching, light sensitivity, double vision, eye pain, blurred vision  Ear/Nose/Throat: Ear discharge(wax), hearing loss, ringing in the ears, runny nose, trouble swallowing Skin: wounds, rash, itching  Cardiovascular: chest pain Respiratory: cough, wheezing, shortness of breath, chest tightness Gastrointestinal: abdominal pain Genitourinary:  testicular pain- thinks that it is due to penile implant Hematology/Lymphatic: bruise/bleed easily Endocrine: cold intolerance, excessive thirst Musculoskeletal:joint pain, back pain, aching muscles, walking difficulty, neck pain, neck stiffness Allergy/Immunology: environmental allergies Neurological: memory loss, dizziness, headache, numbness, speech difficulty, weakness, tremors Psychiatric: behavior problem, confusion, depression, nervous/anxious Sleep: restless leg, insomnia, apnea, frequent walking, daytime sleepiness   ALLERGIES: Allergies  Allergen Reactions  . Codeine Itching  . Gabapentin     Jittery    HOME MEDICATIONS: Outpatient Prescriptions Prior to Visit  Medication Sig Dispense Refill  . acetaminophen (TYLENOL) 325 MG tablet Take 325 mg by mouth every 6 (six) hours as needed.        Marland Kitchen aspirin 81 MG tablet Take 81 mg by mouth daily.        . cholecalciferol (VITAMIN D) 1000 UNITS tablet Take 1,000 Units by mouth daily.        . DULoxetine (CYMBALTA) 60 MG capsule Take 60 mg by mouth daily.        . fluticasone (FLONASE) 50 MCG/ACT nasal spray       . levothyroxine (SYNTHROID, LEVOTHROID) 150 MCG tablet Take 0.175 mcg by mouth daily.       . metFORMIN (GLUCOPHAGE) 500 MG tablet 500 mg 2 (two) times daily.      . metoprolol succinate (TOPROL-XL) 25 MG 24 hr tablet Take 25 mg by mouth daily.        . Multiple Vitamin (MULTIVITAMIN) tablet Take 1 tablet by mouth daily.        . pregabalin (LYRICA) 100 MG capsule Take 1 capsule (100 mg total) by mouth  3 (three) times daily.  90 capsule  5  . zolpidem (AMBIEN) 10 MG tablet Take 10 mg by mouth at bedtime as needed.        . Misc Natural Products (OSTEO BI-FLEX ADV TRIPLE ST PO) Take 1 tablet by mouth 2 (two) times daily.        . ranitidine (ZANTAC) 150 MG capsule       . simvastatin (ZOCOR) 40 MG tablet Take 40 mg by mouth every evening.       No facility-administered medications prior to visit.    PAST MEDICAL  HISTORY: Past Medical History  Diagnosis Date  . Carpal tunnel syndrome     bilateral  . Cervical spondylosis without myelopathy   . Diabetes mellitus without complication   . GERD (gastroesophageal reflux disease)   . OSA (obstructive sleep apnea)     on CPAP  . CAD (coronary artery disease)   . Obesity   . Depression   . Hypothyroid   . Headache(784.0) 07/15/2013  . Dyslipidemia   . Impotence     PAST SURGICAL HISTORY: Past Surgical History  Procedure Laterality Date  . Penile prosthesis implant    . Cataract extraction Left   . Inguinal hernia repair Right   . Lipoma resection Left     neck  . Coronary stent placement      FAMILY HISTORY: History reviewed. No pertinent family history.  SOCIAL HISTORY: History   Social History  . Marital Status: Married    Spouse Name: N/A    Number of Children: 3  . Years of Education: 13.5   Occupational History  . PRESS OPERATOR    Social History Main Topics  . Smoking status: Former Smoker    Quit date: 12/20/1968  . Smokeless tobacco: Never Used  . Alcohol Use: No  . Drug Use: No  . Sexual Activity: Not on file   Other Topics Concern  . Not on file   Social History Narrative  . No narrative on file      PHYSICAL EXAM  Filed Vitals:   01/15/14 0917  BP: 136/82  Pulse: 68  Weight: 203 lb (92.08 kg)   Body mass index is 31.79 kg/(m^2).  Generalized: Well developed, in no acute distress  Ears: Ear canal clear bilaterally. Tympanic membrane intact, light reflex present bilaterally. No discharge present.   Neurological examination  Mentation: Alert oriented to time, place, history taking. Follows all commands speech and language fluent. MMSE 29/30 Cranial nerve II-XII: extraocular movements were full, visual field were full on confrontational test. Facial sensation and strength were normal. hearing was intact to finger rubbing bilaterally. Uvula tongue midline. Head turning and shoulder shrug  were normal  and symmetric.Tongue protrusion into cheek strength was normal. Motor: The motor testing reveals 5 over 5 strength of all 4 extremities. Good symmetric motor tone is noted throughout.  Sensory: Sensory testing is intact to soft touch in all 4 extremities. Decreased pinprick and vibration in the lower extremities. No evidence of extinction is noted.  Coordination: Cerebellar testing reveals good finger-nose-finger and heel-to-shin bilaterally.  Gait and station: Gait is normal. Tandem gait is minimally unsteady. Romberg is negative. No drift is seen.  Reflexes: Deep tendon reflexes are symmetric but depressed bilaterally.  DIAGNOSTIC DATA (LABS, IMAGING, TESTING) - I reviewed patient records, labs, notes, testing and imaging myself where available.  Lab Results  Component Value Date   WBC 5.9 WHITE COUNT CONFIRMED ON SMEAR 12/19/2008   HGB 15.0 03/05/2010  HCT 44.0 03/05/2010   MCV 98.1 12/19/2008   PLT 184 SPECIMEN CHECKED FOR CLOTS PLATELET COUNT CONFIRMED BY SMEAR LARGE PLATELETS PRESENT 12/19/2008      Component Value Date/Time   NA 141 03/05/2010 0804   K 3.7 03/05/2010 0804   CL 101 12/19/2008 1135   CO2 32 12/19/2008 1135   GLUCOSE 158* 03/05/2010 0804   BUN 10 12/19/2008 1135   CREATININE 0.91 12/19/2008 1135   CALCIUM 9.6 12/19/2008 1135   PROT 7.0 12/19/2008 1135   ALBUMIN 4.4 12/19/2008 1135   AST 25 12/19/2008 1135   ALT 21 12/19/2008 1135   ALKPHOS 58 12/19/2008 1135   BILITOT 1.5* 12/19/2008 1135   GFRNONAA >60 12/19/2008 1135   GFRAA  Value: >60        The eGFR has been calculated using the MDRD equation. This calculation has not been validated in all clinical situations. eGFR's persistently <60 mL/min signify possible Chronic Kidney Disease. 12/19/2008 1135    ASSESSMENT AND PLAN 68 y.o. year old male  has a past medical history of Carpal tunnel syndrome; Cervical spondylosis without myelopathy; Diabetes mellitus without complication; GERD (gastroesophageal reflux disease); OSA  (obstructive sleep apnea); CAD (coronary artery disease); Obesity; Depression; Hypothyroid; WPYKDXIP(382.5) (07/15/2013); Dyslipidemia; and Impotence. here with   1. Polyneuropathy in diabetes(357.2) 2. Headache(784.0)  The patient continues to have neuropathy pain and daily headaches. Will increase the Cymbalta to 30 mg in the morning and 60 mg at night for two weeks. If tolerating this dose, then increase to 60 mg in the morning and 60 mg at night.  Continue Lyrica 100 mg three times a day, will refill.  Advised patient that it is not safe to take 9 extra strength tylenols a day, it could cause liver failure. Increase in Cymbalta may help his headaches. Follow-up in 6 months or sooner if needed.     Ward Givens, MSN, NP-C 01/15/2014, 10:00 AM [facility] [address], [place], [place] [address] [phone]  Note: This document was prepared with digital dictation and possible smart phrase technology. Any transcriptional errors that result from this process are unintentional.  Kathrynn Ducking

## 2014-01-21 ENCOUNTER — Telehealth: Payer: Self-pay | Admitting: *Deleted

## 2014-01-21 NOTE — Telephone Encounter (Signed)
Sps calling requesting a letter indicating diagnosis for VA compensation per [place].  She would like a return call.

## 2014-01-22 NOTE — Telephone Encounter (Signed)
I spoke to the patient. I would be happy to sign a medical necessity form for CPAP use and OSA testing, and for the patient to have VA coverage. He may bring the letter or fax it to [place] at [facility].

## 2014-01-22 NOTE — Telephone Encounter (Signed)
I called the patient. The patient needs a letter for the [facility] somehow relating the sleep apnea to the diabetes, for VA benefits. The patient is followed by Dr. Brett Fairy for the sleep apnea, last seen on 12/12/2011. I do not feel comfortable writing this letter, but I will forward this note to Dr. Brett Fairy so that she may call the patient, and decide if she can write a letter for him.

## 2014-01-22 NOTE — Telephone Encounter (Signed)
Pt's wife calling stating that they had put together a letter and bringing it by the office to let Dr. Jannifer Franklin take a look at it, to see if Dr. Jannifer Franklin would sign off on this letter. Wife stated that they needed the letter for their attorney to get VA compensation. Please advise

## 2014-01-27 ENCOUNTER — Encounter: Payer: Self-pay | Admitting: Neurology

## 2014-01-31 IMAGING — CR DG CHEST 2V
2 series · 2 of 2 positions shown · non-contrast
Comparison: PA and lateral chest x-ray May 26, 2010.

CLINICAL DATA: Mid chest discomfort with history of hypertension
and diabetes and coronary artery disease.

EXAM:
CHEST  2 VIEW

[view not recorded (1 of 2)]
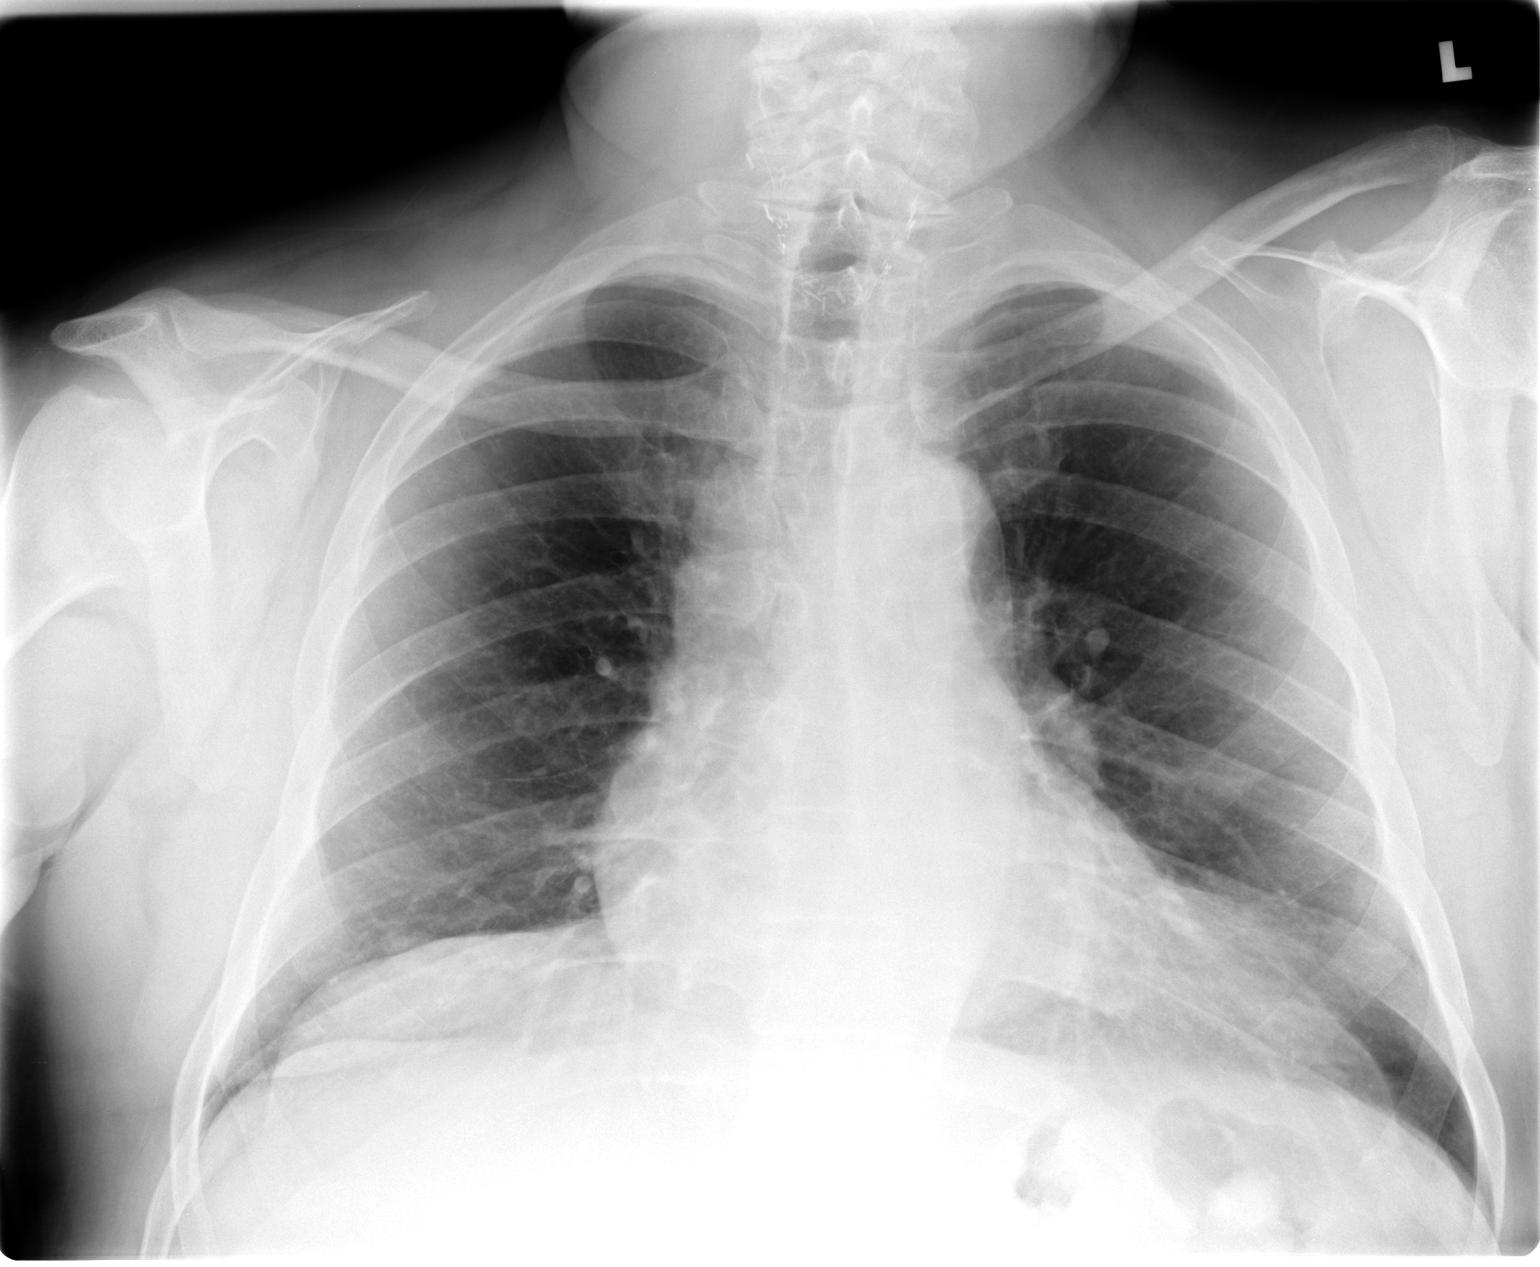

[view not recorded (2 of 2)]
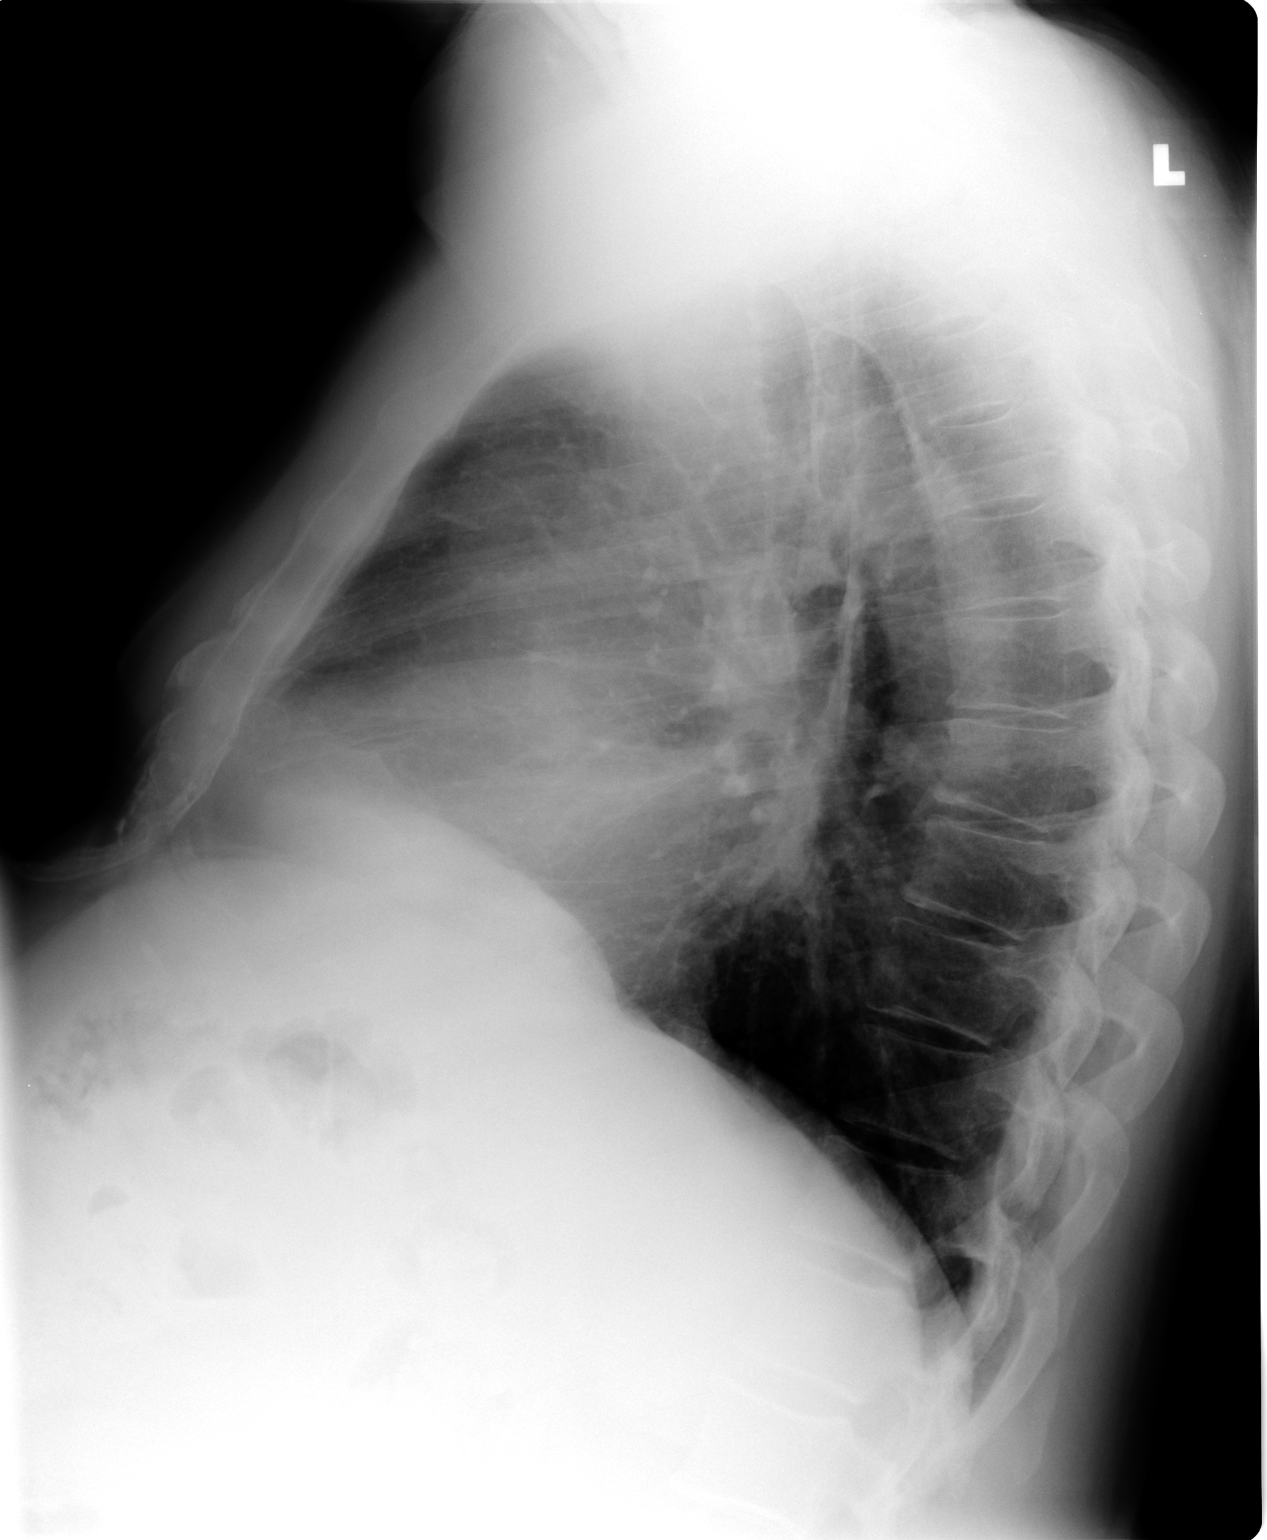

[2 of 2 positions shown; findings below may reference images not displayed]

FINDINGS: The lungs are adequately inflated and clear. The cardiopericardial
silhouette is normal in size. The pulmonary vascularity is not
engorged. The mediastinum is normal in width. There is tortuosity of
the descending thoracic aorta. There is no pleural effusion or
pneumothorax. The observed portions of the bony thorax exhibit no
acute abnormalities. There are surgical clips at the base of the
neck.
IMPRESSION: There is no evidence of pneumonia nor CHF nor other acute
cardiopulmonary abnormality.

## 2014-02-15 ENCOUNTER — Other Ambulatory Visit: Payer: Self-pay | Admitting: Neurology

## 2014-02-18 NOTE — Telephone Encounter (Signed)
Rx signed and faxed.

## 2014-02-20 DIAGNOSIS — E119 Type 2 diabetes mellitus without complications: Secondary | ICD-10-CM | POA: Diagnosis not present

## 2014-02-20 DIAGNOSIS — H2510 Age-related nuclear cataract, unspecified eye: Secondary | ICD-10-CM | POA: Diagnosis not present

## 2014-02-20 DIAGNOSIS — Z961 Presence of intraocular lens: Secondary | ICD-10-CM | POA: Diagnosis not present

## 2014-04-08 DIAGNOSIS — M25569 Pain in unspecified knee: Secondary | ICD-10-CM | POA: Diagnosis not present

## 2014-04-30 DIAGNOSIS — E1149 Type 2 diabetes mellitus with other diabetic neurological complication: Secondary | ICD-10-CM | POA: Diagnosis not present

## 2014-04-30 DIAGNOSIS — Z6835 Body mass index (BMI) 35.0-35.9, adult: Secondary | ICD-10-CM | POA: Diagnosis not present

## 2014-04-30 DIAGNOSIS — E039 Hypothyroidism, unspecified: Secondary | ICD-10-CM | POA: Diagnosis not present

## 2014-04-30 DIAGNOSIS — J069 Acute upper respiratory infection, unspecified: Secondary | ICD-10-CM | POA: Diagnosis not present

## 2014-04-30 DIAGNOSIS — K219 Gastro-esophageal reflux disease without esophagitis: Secondary | ICD-10-CM | POA: Diagnosis not present

## 2014-04-30 DIAGNOSIS — E782 Mixed hyperlipidemia: Secondary | ICD-10-CM | POA: Diagnosis not present

## 2014-04-30 DIAGNOSIS — G609 Hereditary and idiopathic neuropathy, unspecified: Secondary | ICD-10-CM | POA: Diagnosis not present

## 2014-04-30 DIAGNOSIS — E669 Obesity, unspecified: Secondary | ICD-10-CM | POA: Diagnosis not present

## 2014-07-17 ENCOUNTER — Ambulatory Visit: Payer: Medicare Other | Admitting: Neurology

## 2014-08-13 ENCOUNTER — Encounter: Payer: Self-pay | Admitting: Neurology

## 2014-08-19 ENCOUNTER — Encounter: Payer: Self-pay | Admitting: Neurology

## 2015-02-24 DIAGNOSIS — E119 Type 2 diabetes mellitus without complications: Secondary | ICD-10-CM | POA: Diagnosis not present

## 2015-02-24 DIAGNOSIS — H2513 Age-related nuclear cataract, bilateral: Secondary | ICD-10-CM | POA: Diagnosis not present

## 2015-02-24 DIAGNOSIS — D3132 Benign neoplasm of left choroid: Secondary | ICD-10-CM | POA: Diagnosis not present

## 2016-11-30 ENCOUNTER — Other Ambulatory Visit: Payer: Self-pay | Admitting: Family Medicine

## 2016-11-30 DIAGNOSIS — M79671 Pain in right foot: Secondary | ICD-10-CM

## 2018-03-20 ENCOUNTER — Ambulatory Visit: Payer: Non-veteran care | Admitting: Physical Therapy

## 2018-03-26 ENCOUNTER — Ambulatory Visit: Payer: Medicare Other | Attending: Physical Therapy | Admitting: Physical Therapy

## 2018-03-26 ENCOUNTER — Other Ambulatory Visit: Payer: Self-pay

## 2018-03-26 DIAGNOSIS — R2681 Unsteadiness on feet: Secondary | ICD-10-CM | POA: Insufficient documentation

## 2018-03-26 NOTE — Therapy (Signed)
[place] [address] [place], [place], [address] Phone: [phone]   Fax:  [phone]  Physical Therapy Evaluation  Patient Details  Name: Richard Ruiz MRN: [id number] Date of Birth: 11-08-1945 Referring Provider: Gerilyn Nestle   Encounter Date: 03/26/2018  PT End of Session - 03/26/18 1528    Visit Number  1    Number of Visits  15    Date for PT Re-Evaluation  06/16/18    Authorization Type  VA - 15 visits authorized by 06/16/18    PT Start Time  6389    PT Stop Time  1524    PT Time Calculation (min)  49 min    Activity Tolerance  Patient tolerated treatment well    Behavior During Therapy  [facility] for tasks assessed/performed       Past Medical History:  Diagnosis Date  . CAD (coronary artery disease)   . Carpal tunnel syndrome    bilateral  . Cervical spondylosis without myelopathy   . Depression   . Diabetes mellitus without complication ([place])   . Dyslipidemia   . GERD (gastroesophageal reflux disease)   . Headache(784.0) 07/15/2013  . Hypothyroid   . Impotence   . Obesity   . OSA (obstructive sleep apnea)    on CPAP    Past Surgical History:  Procedure Laterality Date  . CATARACT EXTRACTION Left   . CORONARY STENT PLACEMENT    . INGUINAL HERNIA REPAIR Right   . LIPOMA RESECTION Left    neck  . PENILE PROSTHESIS IMPLANT      There were no vitals filed for this visit.   Subjective Assessment - 03/26/18 1449    Subjective  Patient has been walking with a cane for at least one year. Patient was at [facility] and his liver MD sent him to PT for balance assessment. He was given a walker. He now comes here due to convenience.    Pertinent History  fibromyalgia, DM, neuropathy, CAD, PTSD, sleep apnea    Patient Stated Goals  improve balance    Currently in Pain?  Yes chronic all over pain    Pain Score  5          [facility] PT Assessment - 03/26/18 0001      Assessment   Medical Diagnosis  fall  risk    Referring Provider  Gerilyn Nestle    Onset Date/Surgical Date  03/08/18      Precautions   Precautions  Fall      Balance Screen   Has the patient fallen in the past 6 months  Yes 2 inside, one outside bending forward    How many times?  3    Has the patient had a decrease in activity level because of a fear of falling?   Yes    Is the patient reluctant to leave their home because of a fear of falling?   No      Home Environment   Living Environment  Private residence    Living Arrangements  Spouse/significant other    Type of [place] to enter    Entrance Stairs-Number of Steps  2    Entrance Stairs-Rails  Can reach both    [place]  One level    [place] - single point;Grab bars - tub/shower;Walker - 4 wheels      Prior Function  Level of Independence  Independent with household mobility with device    Vocation  Retired      ROM / Strength   AROM / PROM / Strength  AROM;Strength      Strength   Overall Strength Comments  RLE grossly 5/5; LLE grossly 4+/5      Ambulation/Gait   Ambulation/Gait  Yes    Ambulation/Gait Assistance  6: Modified independent (Device/Increase time)    Ambulation Distance (Feet)  20 Feet    Assistive device  None    Gait Pattern  Decreased step length - right;Decreased step length - left;Decreased stride length;Right flexed knee in stance;Left flexed knee in stance    Ambulation Surface  Level      Balance   Balance Assessed  Yes      Standardized Balance Assessment   Standardized Balance Assessment  Berg Balance Test;Timed Up and Go Test;Five Times Sit to Stand    Five times sit to stand comments   26 sec without hands      Berg Balance Test   Sit to Stand  Able to stand without using hands and stabilize independently    Standing Unsupported  Able to stand 2 minutes with supervision    Sitting with Back Unsupported but Feet Supported on Floor or Stool  Able to sit safely and securely 2  minutes    Stand to Sit  Sits safely with minimal use of hands    Transfers  Able to transfer safely, minor use of hands    Standing Unsupported with Eyes Closed  Able to stand 10 seconds with supervision    Standing Ubsupported with Feet Together  Able to place feet together independently and stand 1 minute safely    From Standing, Reach Forward with Outstretched Arm  Can reach forward >12 cm safely (5")    From Standing Position, Pick up Object from Floor  Able to pick up shoe, needs supervision    From Standing Position, Turn to Look Behind Over each Shoulder  Needs supervision when turning    Turn 360 Degrees  Able to turn 360 degrees safely but slowly    Standing Unsupported, Alternately Place Feet on Step/Stool  Able to stand independently and complete 8 steps >20 seconds    Standing Unsupported, One Foot in Front  Able to plae foot ahead of the other independently and hold 30 seconds 15 seconds but LOB initially    Standing on One Leg  Able to lift leg independently and hold equal to or more than 3 seconds    Total Score  43      Timed Up and Go Test   Normal TUG (seconds)  45                Objective measurements completed on examination: See above findings.              PT Education - 03/26/18 1527    Education Details  HEP- SLS at counter; sit to stand    Person(s) Educated  Patient    Methods  Explanation;Demonstration;Handout    Comprehension  Verbalized understanding;Returned demonstration       PT Short Term Goals - 03/26/18 1541      PT SHORT TERM GOAL #1   Title  I with initial HEP    Time  4    Period  Weeks    Status  New      PT SHORT TERM GOAL #2   Title  improved  BERG Balance to 48/56    Time  6    Period  Weeks    Status  New    Target Date  05/07/18      PT SHORT TERM GOAL #3   Title  Improve TUG to 25 seconds    Time  6    Period  Weeks    Status  New    Target Date  05/07/18      PT SHORT TERM GOAL #4   Title  improved  5x sit to stand 10 seconds    Time  6    Period  Weeks    Target Date  05/07/18        PT Long Term Goals - 03/26/18 1544      PT LONG TERM GOAL #1   Title  Improved Berg Balance to 52/56 or better    Time  12    Period  Weeks    Status  New    Target Date  06/17/19      PT LONG TERM GOAL #2   Title  Patient to report no falls in past 2 months or more    Time  12    Period  Weeks    Status  New      PT LONG TERM GOAL #3   Title  Improved TUG to 15 seconds     Time  12    Period  Weeks    Status  New      PT LONG TERM GOAL #4   Title  Patient able to ambulate safely around building with [facility]    Time  12    Status  New             Plan - 03/26/18 1531    Clinical Impression Statement  Patient presents today for balance assessment due to 3 falls in the past six months. He was assessed at the [facility] and prescribed a Rollator for the community. He uses a cane at home. He has neuropathy, but can feel his feet. He has been assessed by neurology and his vestibular system has been cleared. He reports his head feels like things are moving with certain balance activities though. He has strong legs and good core strength with standing balance. Patient will benefit from PT to address balance.     History and Personal Factors relevant to plan of care:  fibromyalgia, DM, neuropathy, CAD, PTSD, sleep apnea    Clinical Presentation  Stable    Clinical Decision Making  Low    Rehab Potential  Good    Clinical Impairments Affecting Rehab Potential  neuropathy, PTSD    PT Frequency  2x / week    PT Duration  12 weeks    PT Treatment/Interventions  ADLs/Self Care Home Management;Gait training;Neuromuscular re-education;Balance training;Therapeutic exercise;Patient/family education;Manual techniques    PT Next Visit Plan  balance/gait    PT Home Exercise Plan  SLS, sit to stand    Consulted and Agree with Plan of Care  Patient       Patient will benefit from skilled therapeutic  intervention in order to improve the following deficits and impairments:  Abnormal gait, Decreased balance  Visit Diagnosis: Unsteadiness on feet     Problem List Patient Active Problem List   Diagnosis Date Noted  . Headache(784.0) 07/15/2013  . Abnormality of gait 02/28/2013  . Obstructive sleep apnea (adult) (pediatric) 02/28/2013  . Type II or unspecified type diabetes mellitus with neurological manifestations,  not stated as uncontrolled(250.60) 02/28/2013  . Polyneuropathy in diabetes(357.2) 02/28/2013    Deatrice Spanbauer PT 03/26/2018, 3:53 PM  [address] [address] [place], [place], [address] Phone: [phone]   Fax:  [phone]  Name: Richard Ruiz MRN: [id number] Date of Birth: Oct 02, 1945

## 2018-04-03 ENCOUNTER — Ambulatory Visit: Payer: Medicare Other | Admitting: Physical Therapy

## 2018-04-03 DIAGNOSIS — R2681 Unsteadiness on feet: Secondary | ICD-10-CM

## 2018-04-03 NOTE — Therapy (Signed)
[place], [place], [address] Phone: [phone]   Fax:  [phone]  Physical Therapy Treatment  Patient Details  Name: Richard Ruiz MRN: [id number] Date of Birth: [birth year] Referring Provider: Gerilyn Nestle   Encounter Date: 04/03/2018  PT End of Session - 04/03/18 1142    Visit Number  2    Number of Visits  15    Date for PT Re-Evaluation  06/16/18    Authorization Type  VA - 15 visits authorized by 06/16/18    PT Start Time  1102    PT Stop Time  1142    PT Time Calculation (min)  40 min    Activity Tolerance  Patient tolerated treatment well    Behavior During Therapy  [facility] for tasks assessed/performed       Past Medical History:  Diagnosis Date  . CAD (coronary artery disease)   . Carpal tunnel syndrome    bilateral  . Cervical spondylosis without myelopathy   . Depression   . Diabetes mellitus without complication ([place])   . Dyslipidemia   . GERD (gastroesophageal reflux disease)   . Headache(784.0) 07/15/2013  . Hypothyroid   . Impotence   . Obesity   . OSA (obstructive sleep apnea)    on CPAP    Past Surgical History:  Procedure Laterality Date  . CATARACT EXTRACTION Left   . CORONARY STENT PLACEMENT    . INGUINAL HERNIA REPAIR Right   . LIPOMA RESECTION Left    neck  . PENILE PROSTHESIS IMPLANT      There were no vitals filed for this visit.  Subjective Assessment - 04/03/18 1145    Subjective  Patient reports no new complaints.    Patient Stated Goals  improve balance    Currently in Pain?  No/denies                       [facility] Adult PT Treatment/Exercise - 04/03/18 0001      Exercises   Exercises  Knee/Hip      Knee/Hip Exercises: Machines for Strengthening   Cybex Knee Extension  20# 2x10    Cybex Knee Flexion  20# 2x10    Cybex Leg Press  30#  4x10, then LLE x10 at 20#    Other Machine  heel raises 20# x 20      Knee/Hip  Exercises: Standing   Hip Flexion  Stengthening;Both;3 sets;10 reps;Knee bent red band    Terminal Knee Extension  Strengthening;Both;2 sets;10 reps;Theraband 5 sec hold    Theraband Level (Terminal Knee Extension)  Level 2 (Red)    Hip Abduction  Stengthening;Both;3 sets;10 reps;Knee straight red tband    Hip Extension  Stengthening;Both;3 sets;10 reps             PT Education - 04/03/18 1146    Education Details  HEP    Person(s) Educated  Patient    Methods  Explanation;Demonstration;Handout    Comprehension  Verbalized understanding;Returned demonstration       PT Short Term Goals - 03/26/18 1541      PT SHORT TERM GOAL #1   Title  I with initial HEP    Time  4    Period  Weeks    Status  New      PT SHORT TERM GOAL #2   Title  improved BERG Balance to 48/56    Time  6  Period  Weeks    Status  New    Target Date  05/07/18      PT SHORT TERM GOAL #3   Title  Improve TUG to 25 seconds    Time  6    Period  Weeks    Status  New    Target Date  05/07/18      PT SHORT TERM GOAL #4   Title  improved 5x sit to stand 10 seconds    Time  6    Period  Weeks    Target Date  05/07/18        PT Long Term Goals - 03/26/18 1544      PT LONG TERM GOAL #1   Title  Improved Berg Balance to 52/56 or better    Time  12    Period  Weeks    Status  New    Target Date  06/17/19      PT LONG TERM GOAL #2   Title  Patient to report no falls in past 2 months or more    Time  12    Period  Weeks    Status  New      PT LONG TERM GOAL #3   Title  Improved TUG to 15 seconds     Time  12    Period  Weeks    Status  New      PT LONG TERM GOAL #4   Title  Patient able to ambulate safely around building with [facility]    Time  12    Status  New            Plan - 04/03/18 1147    Clinical Impression Statement  patient did very well with TE today stating "it feels good". No goals as only second visit.    Rehab Potential  Good    Clinical Impairments Affecting  Rehab Potential  neuropathy, PTSD    PT Frequency  2x / week    PT Duration  12 weeks    PT Treatment/Interventions  ADLs/Self Care Home Management;Gait training;Neuromuscular re-education;Balance training;Therapeutic exercise;Patient/family education;Manual techniques    PT Next Visit Plan  balance/gait    PT Home Exercise Plan  SLS, sit to stand, standing hip ABD, flex, ext with tband    Consulted and Agree with Plan of Care  Patient       Patient will benefit from skilled therapeutic intervention in order to improve the following deficits and impairments:  Abnormal gait, Decreased balance  Visit Diagnosis: Unsteadiness on feet     Problem List Patient Active Problem List   Diagnosis Date Noted  . Headache(784.0) 07/15/2013  . Abnormality of gait 02/28/2013  . Obstructive sleep apnea (adult) (pediatric) 02/28/2013  . 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 Polyneuropathy in diabetes(357.2) 02/28/2013    Letoya Stallone PT 04/03/2018, 11:49 AM  [place] [place], [place], [address] Phone: [phone]   Fax:  [phone]  Name: DAJOHN ELLENDER MRN: [id number] Date of Birth: Apr 22, 1946

## 2018-04-03 NOTE — Patient Instructions (Signed)
ABDUCTION: Standing - Resistance Band (Active)   Stand, feet flat. Against resistance band, lift leg out to side. Complete _1-3 sets of _10__ repetitions. Perform _1__ sessions per day.   Strengthening: Hip Extension - Resisted   With tubing around ankle, face anchor and pull leg straight back. Repeat __10__ times per set. Do __1-3__ sets per session. Do __1__ sessions per day.  Knee High Place band around feet    Holding stable object, raise knee to hip level, then lower knee. Repeat with other knee. Complete __10_ repetitions. Do 1-3 sets of 10. Do __2__ sessions per day.  Madelyn Flavors, PT 04/03/18 11:42 AM

## 2018-04-05 ENCOUNTER — Encounter: Payer: Self-pay | Admitting: Physical Therapy

## 2018-04-05 ENCOUNTER — Ambulatory Visit: Payer: Medicare Other | Admitting: Physical Therapy

## 2018-04-05 DIAGNOSIS — R2681 Unsteadiness on feet: Secondary | ICD-10-CM | POA: Diagnosis not present

## 2018-04-05 NOTE — Therapy (Signed)
[place] [address] [place], [place], [address] Phone: [phone]   Fax:  [phone]  Physical Therapy Treatment  Patient Details  Name: Richard Ruiz MRN: [id number] Date of Birth: 03-22-1946 Referring Provider: Gerilyn Nestle   Encounter Date: 04/05/2018  PT End of Session - 04/05/18 0958    Visit Number  3    Date for PT Re-Evaluation  06/16/18    Authorization Type  VA - 15 visits authorized by 06/16/18    PT Start Time  0915    PT Stop Time  0958    PT Time Calculation (min)  43 min    Activity Tolerance  Patient tolerated treatment well    Behavior During Therapy  [facility] for tasks assessed/performed       Past Medical History:  Diagnosis Date  . CAD (coronary artery disease)   . Carpal tunnel syndrome    bilateral  . Cervical spondylosis without myelopathy   . Depression   . Diabetes mellitus without complication ([place])   . Dyslipidemia   . GERD (gastroesophageal reflux disease)   . Headache(784.0) 07/15/2013  . Hypothyroid   . Impotence   . Obesity   . OSA (obstructive sleep apnea)    on CPAP    Past Surgical History:  Procedure Laterality Date  . CATARACT EXTRACTION Left   . CORONARY STENT PLACEMENT    . INGUINAL HERNIA REPAIR Right   . LIPOMA RESECTION Left    neck  . PENILE PROSTHESIS IMPLANT      There were no vitals filed for this visit.  Subjective Assessment - 04/05/18 0918    Subjective  pt reports that he is doing ok    Pain Score  5     Multiple Pain Sites  Yes                       [facility] Adult PT Treatment/Exercise - 04/05/18 0001      Exercises   Exercises  Knee/Hip      Knee/Hip Exercises: Aerobic   Nustep  L4 x 6 min      Knee/Hip Exercises: Machines for Strengthening   Cybex Knee Extension  20# 2x10    Cybex Knee Flexion  25# 2x10    Cybex Leg Press  30#  4x10, then LLE x10 at 20#      Knee/Hip Exercises: Standing   Heel Raises  Both;1  set;15 reps    Hip Abduction  Stengthening;Both;10 reps;Knee straight;2 sets    Abduction Limitations  red tband    Hip Extension  Stengthening;Both;10 reps;2 sets;Knee straight    Extension Limitations  red tband     Other Standing Knee Exercises  Alt 6in box taps x10    Other Standing Knee Exercises  with forward reache with yellow ball.      Knee/Hip Exercises: Seated   Sit to Sand  2 sets;with UE support;without UE support;10 reps cues for proper posture and form               PT Short Term Goals - 04/05/18 0958      PT SHORT TERM GOAL #1   Title  I with initial HEP    Status  Partially Met        PT Long Term Goals - 03/26/18 1544      PT LONG TERM GOAL #1   Title  Improved Berg Balance to 52/56 or better  Time  12    Period  Weeks    Status  New    Target Date  06/17/19      PT LONG TERM GOAL #2   Title  Patient to report no falls in past 2 months or more    Time  12    Period  Weeks    Status  New      PT LONG TERM GOAL #3   Title  Improved TUG to 15 seconds     Time  12    Period  Weeks    Status  New      PT LONG TERM GOAL #4   Title  Patient able to ambulate safely around building with [facility]    Time  12    Status  New            Plan - 04/05/18 7519    Clinical Impression Statement  Pt continues to do well with TE. He does fatigue quick with standing marches. Some instability with alternating box taps.    Rehab Potential  Good    Clinical Impairments Affecting Rehab Potential  neuropathy, PTSD    PT Frequency  2x / week    PT Duration  12 weeks    PT Treatment/Interventions  ADLs/Self Care Home Management;Gait training;Neuromuscular re-education;Balance training;Therapeutic exercise;Patient/family education;Manual techniques    PT Next Visit Plan  balance/gait       Patient will benefit from skilled therapeutic intervention in order to improve the following deficits and impairments:  Abnormal gait, Decreased balance  Visit  Diagnosis: Unsteadiness on feet     Problem List Patient Active Problem List   Diagnosis Date Noted  . Headache(784.0) 07/15/2013  . Abnormality of gait 02/28/2013  . Obstructive sleep apnea (adult) (pediatric) 02/28/2013  . 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 Polyneuropathy in diabetes(357.2) 02/28/2013    Scot Jun, PTA 04/05/2018, 9:59 AM  [place], [place], [address] Phone: [phone]   Fax:  [phone]  Name: Richard Ruiz MRN: [id number] Date of Birth: 06-18-1946

## 2018-04-09 ENCOUNTER — Ambulatory Visit: Payer: Medicare Other | Admitting: Physical Therapy

## 2018-04-09 ENCOUNTER — Encounter: Payer: Self-pay | Admitting: Physical Therapy

## 2018-04-09 DIAGNOSIS — R2681 Unsteadiness on feet: Secondary | ICD-10-CM | POA: Diagnosis not present

## 2018-04-09 NOTE — Therapy (Signed)
[place], [place], [address] Phone: [phone]   Fax:  [phone]  Physical Therapy Treatment  Patient Details  Name: Richard Ruiz MRN: [id number] Date of Birth: [birth year] Referring Provider: Gerilyn Nestle   Encounter Date: 04/09/2018  PT End of Session - 04/09/18 1047    Visit Number  4    Number of Visits  15    Date for PT Re-Evaluation  06/16/18    Authorization Type  VA - 15 visits authorized by 06/16/18    PT Start Time  1005    PT Stop Time  1045    PT Time Calculation (min)  40 min    Activity Tolerance  Patient tolerated treatment well    Behavior During Therapy  [facility] for tasks assessed/performed       Past Medical History:  Diagnosis Date  . CAD (coronary artery disease)   . Carpal tunnel syndrome    bilateral  . Cervical spondylosis without myelopathy   . Depression   . Diabetes mellitus without complication ([place])   . Dyslipidemia   . GERD (gastroesophageal reflux disease)   . Headache(784.0) 07/15/2013  . Hypothyroid   . Impotence   . Obesity   . OSA (obstructive sleep apnea)    on CPAP    Past Surgical History:  Procedure Laterality Date  . CATARACT EXTRACTION Left   . CORONARY STENT PLACEMENT    . INGUINAL HERNIA REPAIR Right   . LIPOMA RESECTION Left    neck  . PENILE PROSTHESIS IMPLANT      There were no vitals filed for this visit.  Subjective Assessment - 04/09/18 1009    Subjective  Pt reports that "he is feeling alright."    Currently in Pain?  Yes    Pain Score  3     Pain Location  Other (Comment)    Pain Orientation  Other (Comment)                       [facility] Adult PT Treatment/Exercise - 04/09/18 0001      Knee/Hip Exercises: Aerobic   Nustep  L4 x 6 min      Knee/Hip Exercises: Machines for Strengthening   Cybex Knee Extension  20# 2x15    Cybex Knee Flexion  25# 2x15    Cybex Leg Press  30#  4x10, then LLE x10 at  20#      Knee/Hip Exercises: Standing   Heel Raises  Both;1 set;15 reps;1 second    Hip Abduction  Stengthening;Both;10 reps;Knee straight;2 sets    Abduction Limitations  red tband    Hip Extension  Stengthening;Both;10 reps;2 sets;Knee straight    Extension Limitations  red tband     Other Standing Knee Exercises  Alt 6in box taps 2x10      Knee/Hip Exercises: Seated   Marching  Both;2 sets;10 reps Yellow theraband across legs    Sit to Sand  2 sets;10 reps yellow ball out in front                PT Short Term Goals - 04/05/18 0958      PT SHORT TERM GOAL #1   Title  I with initial HEP    Status  Partially Met        PT Long Term Goals - 03/26/18 1544      PT LONG TERM GOAL #1   Title  Improved Berg Balance to 52/56 or better    Time  12    Period  Weeks    Status  New    Target Date  06/17/19      PT LONG TERM GOAL #2   Title  Patient to report no falls in past 2 months or more    Time  12    Period  Weeks    Status  New      PT LONG TERM GOAL #3   Title  Improved TUG to 15 seconds     Time  12    Period  Weeks    Status  New      PT LONG TERM GOAL #4   Title  Patient able to ambulate safely around building with [facility]    Time  12    Status  New            Plan - 04/09/18 1048    Clinical Impression Statement  Pt did very well with treatment. Pt tolerated increased workload on resisted exercises. Pt exhibited slight loss of balanace with box taps. Pt required verbal cues for sit to stands for posture and completion of exercise with balanced foot pressure.     Rehab Potential  Good    PT Frequency  2x / week    PT Duration  12 weeks    PT Treatment/Interventions  ADLs/Self Care Home Management;Gait training;Neuromuscular re-education;Balance training;Therapeutic exercise;Patient/family education;Manual techniques    PT Next Visit Plan  balance/gait, progress with functional activities    PT Home Exercise Plan  SLS, sit to stand, standing hip  ABD, flex, ext with tband    Consulted and Agree with Plan of Care  Patient       Patient will benefit from skilled therapeutic intervention in order to improve the following deficits and impairments:  Abnormal gait, Decreased balance  Visit Diagnosis: Unsteadiness on feet     Problem List Patient Active Problem List   Diagnosis Date Noted  . Headache(784.0) 07/15/2013  . Abnormality of gait 02/28/2013  . Obstructive sleep apnea (adult) (pediatric) 02/28/2013  . 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 Polyneuropathy in diabetes(357.2) 02/28/2013    Maryelizabeth Kaufmann, SPTA 04/09/2018, 10:55 AM  [place], [place], [address] Phone: [phone]   Fax:  [phone]  Name: Richard Ruiz MRN: [id number] Date of Birth: 1945/12/21

## 2018-04-12 ENCOUNTER — Ambulatory Visit: Payer: Medicare Other | Admitting: Physical Therapy

## 2018-04-12 DIAGNOSIS — R2681 Unsteadiness on feet: Secondary | ICD-10-CM | POA: Diagnosis not present

## 2018-04-12 NOTE — Therapy (Signed)
[place] [place], [place], [address] Phone: [phone]   Fax:  [phone]  Physical Therapy Treatment  Patient Details  Name: Richard Ruiz MRN: [id number] Date of Birth: 04-05-1946 Referring Provider: Gerilyn Nestle   Encounter Date: 04/12/2018  PT End of Session - 04/12/18 1018    Visit Number  5    Number of Visits  15    Date for PT Re-Evaluation  06/16/18    Authorization Type  VA - 15 visits authorized by 06/16/18    PT Start Time  3704    PT Stop Time  1102    PT Time Calculation (min)  44 min    Activity Tolerance  Patient tolerated treatment well    Behavior During Therapy  [facility] for tasks assessed/performed       Past Medical History:  Diagnosis Date  . CAD (coronary artery disease)   . Carpal tunnel syndrome    bilateral  . Cervical spondylosis without myelopathy   . Depression   . Diabetes mellitus without complication ([place])   . Dyslipidemia   . GERD (gastroesophageal reflux disease)   . Headache(784.0) 07/15/2013  . Hypothyroid   . Impotence   . Obesity   . OSA (obstructive sleep apnea)    on CPAP    Past Surgical History:  Procedure Laterality Date  . CATARACT EXTRACTION Left   . CORONARY STENT PLACEMENT    . INGUINAL HERNIA REPAIR Right   . LIPOMA RESECTION Left    neck  . PENILE PROSTHESIS IMPLANT      There were no vitals filed for this visit.  Subjective Assessment - 04/12/18 1109    Subjective  No new complaints.    Pertinent History  fibromyalgia, DM, neuropathy, CAD, PTSD, sleep apnea    Patient Stated Goals  improve balance    Currently in Pain?  Yes    Pain Score  3     Pain Location  -- all over                       [facility] Adult PT Treatment/Exercise - 04/12/18 0001      Knee/Hip Exercises: Standing   Forward Step Up  Both;20 reps;Hand Hold: 0;Step Height: 4" cues to engage core and glutes, 1 set of 8 in first 1 UE sup    Walking  with Sports Cord  Black band in hallway forward x 120 ft; bwd x 60 ft, sideways x 30 ft ea    Gait Training  over obstacles for balance    Other Standing Knee Exercises  Alt 6in box taps 2x10 no UE support    Other Standing Knee Exercises  airex; eyes closed, eyes open head turns/up down; step ups x 20, ball reach and figure 8's               PT Short Term Goals - 04/05/18 8889      PT SHORT TERM GOAL #1   Title  I with initial HEP    Status  Partially Met        PT Long Term Goals - 04/12/18 1107      PT LONG TERM GOAL #3   Title  Improved TUG to 15 seconds     Baseline  20 seconds as of 04/12/18    Time  12    Period  Weeks    Status  On-going  Plan - 04/12/18 1109    Clinical Impression Statement  patient did very well today with balance activities. He had several incidences of LOB but improved with cues for core and gluteal activation. Improved TUG score by 23 points.    Rehab Potential  Good    Clinical Impairments Affecting Rehab Potential  neuropathy, PTSD    PT Frequency  2x / week    PT Duration  12 weeks    PT Treatment/Interventions  ADLs/Self Care Home Management;Gait training;Neuromuscular re-education;Balance training;Therapeutic exercise;Patient/family education;Manual techniques    PT Next Visit Plan  balance/gait, progress with functional activities    PT Home Exercise Plan  SLS, sit to stand, standing hip ABD, flex, ext with tband       Patient will benefit from skilled therapeutic intervention in order to improve the following deficits and impairments:  Abnormal gait, Decreased balance  Visit Diagnosis: Unsteadiness on feet     Problem List Patient Active Problem List   Diagnosis Date Noted  . Headache(784.0) 07/15/2013  . Abnormality of gait 02/28/2013  . Obstructive sleep apnea (adult) (pediatric) 02/28/2013  . Type II or unspecified type diabetes mellitus with neurological manifestations, not stated as  uncontrolled(250.60) 02/28/2013  . Polyneuropathy in diabetes(357.2) 02/28/2013    Shuayb Schepers PT 04/12/2018, 12:29 PM  [place] [address] [place] [place], [place], [address] Phone: [phone]   Fax:  [phone]  Name: Richard Ruiz MRN: [id number] Date of Birth: 10-Nov-1945

## 2018-04-16 ENCOUNTER — Ambulatory Visit: Payer: Medicare Other | Admitting: Physical Therapy

## 2018-04-16 DIAGNOSIS — R2681 Unsteadiness on feet: Secondary | ICD-10-CM | POA: Diagnosis not present

## 2018-04-16 NOTE — Therapy (Signed)
[place], [place], [address] Phone: [phone]   Fax:  [phone]  Physical Therapy Treatment  Patient Details  Name: Richard Ruiz MRN: [id number] Date of Birth: 1946/01/28 Referring Provider: Gerilyn Nestle   Encounter Date: 04/16/2018  PT End of Session - 04/16/18 1059    Visit Number  6    Number of Visits  15    Date for PT Re-Evaluation  06/16/18    Authorization Type  VA - 15 visits authorized by 06/16/18    PT Start Time  1016    PT Stop Time  1055    PT Time Calculation (min)  39 min    Activity Tolerance  Patient tolerated treatment well    Behavior During Therapy  [facility] for tasks assessed/performed       Past Medical History:  Diagnosis Date  . CAD (coronary artery disease)   . Carpal tunnel syndrome    bilateral  . Cervical spondylosis without myelopathy   . Depression   . Diabetes mellitus without complication ([place])   . Dyslipidemia   . GERD (gastroesophageal reflux disease)   . Headache(784.0) 07/15/2013  . Hypothyroid   . Impotence   . Obesity   . OSA (obstructive sleep apnea)    on CPAP    Past Surgical History:  Procedure Laterality Date  . CATARACT EXTRACTION Left   . CORONARY STENT PLACEMENT    . INGUINAL HERNIA REPAIR Right   . LIPOMA RESECTION Left    neck  . PENILE PROSTHESIS IMPLANT      There were no vitals filed for this visit.  Subjective Assessment - 04/16/18 1017    Subjective  Pt reports feeling dizzy this morning and having a headache.     Currently in Pain?  No/denies                       [facility] Adult PT Treatment/Exercise - 04/16/18 0001      Knee/Hip Exercises: Aerobic   Recumbent Bike  L1 x 6 min      Knee/Hip Exercises: Machines for Strengthening   Cybex Knee Extension  20# 2x15    Cybex Knee Flexion  25# 2x15    Cybex Leg Press  40#  3x10, then LLE x10 at 20#      Knee/Hip Exercises: Standing   Forward Step  Up  Both;20 reps;Hand Hold: 0;Step Height: 4"    Walking with Sports Cord  30lb. 4 ways x 5 ea way    Gait Training  over obstacles for balance    Other Standing Knee Exercises  Alt 6in box taps 2x10 no UE support    Other Standing Knee Exercises  airex eyes closed 3x20 sec               PT Short Term Goals - 04/05/18 0958      PT SHORT TERM GOAL #1   Title  I with initial HEP    Status  Partially Met        PT Long Term Goals - 04/12/18 1107      PT LONG TERM GOAL #3   Title  Improved TUG to 15 seconds     Baseline  20 seconds as of 04/12/18    Time  12    Period  Weeks    Status  On-going            Plan -  04/16/18 1100    Clinical Impression Statement  Pt reported following treatment that headache was gone and he felt better. Pt tolerated treatment well and handled balance tasks well. Pt experienced LOB several times during balance activities, but was able to improve with verbal and tactile cues for activation and slowing down.     Clinical Impairments Affecting Rehab Potential  neuropathy, PTSD    PT Frequency  2x / week    PT Duration  12 weeks    PT Treatment/Interventions  ADLs/Self Care Home Management;Gait training;Neuromuscular re-education;Balance training;Therapeutic exercise;Patient/family education;Manual techniques    PT Next Visit Plan  balance/gait, progress with functional activities    PT Home Exercise Plan  SLS, sit to stand, standing hip ABD, flex, ext with tband       Patient will benefit from skilled therapeutic intervention in order to improve the following deficits and impairments:  Abnormal gait, Decreased balance  Visit Diagnosis: Unsteadiness on feet     Problem List Patient Active Problem List   Diagnosis Date Noted  . Headache(784.0) 07/15/2013  . Abnormality of gait 02/28/2013  . Obstructive sleep apnea (adult) (pediatric) 02/28/2013  . Type II or unspecified type diabetes mellitus with neurological manifestations, not  stated as uncontrolled(250.60) 02/28/2013  . Polyneuropathy in diabetes(357.2) 02/28/2013    Maryelizabeth Kaufmann, SPTA 04/16/2018, 11:04 AM  [place], [place], [address] Phone: [phone]   Fax:  [phone]  Name: Richard Ruiz MRN: [id number] Date of Birth: 04-Jan-1946

## 2018-04-19 ENCOUNTER — Ambulatory Visit: Payer: Medicare Other | Admitting: Physical Therapy

## 2018-04-19 ENCOUNTER — Encounter: Payer: Self-pay | Admitting: Physical Therapy

## 2018-04-19 DIAGNOSIS — R2681 Unsteadiness on feet: Secondary | ICD-10-CM

## 2018-04-19 NOTE — Therapy (Signed)
[place] [address] [place], [place], [address] Phone: [phone]   Fax:  [phone]  Physical Therapy Treatment  Patient Details  Name: Richard Ruiz MRN: [id number] Date of Birth: 1945-11-02 Referring Provider: Gerilyn Nestle   Encounter Date: 04/19/2018  PT End of Session - 04/19/18 1100    Visit Number  7    Number of Visits  15    Date for PT Re-Evaluation  06/16/18    PT Start Time  8101    PT Stop Time  1100    PT Time Calculation (min)  45 min    Activity Tolerance  Patient tolerated treatment well    Behavior During Therapy  [facility] for tasks assessed/performed       Past Medical History:  Diagnosis Date  . CAD (coronary artery disease)   . Carpal tunnel syndrome    bilateral  . Cervical spondylosis without myelopathy   . Depression   . Diabetes mellitus without complication ([place])   . Dyslipidemia   . GERD (gastroesophageal reflux disease)   . Headache(784.0) 07/15/2013  . Hypothyroid   . Impotence   . Obesity   . OSA (obstructive sleep apnea)    on CPAP    Past Surgical History:  Procedure Laterality Date  . CATARACT EXTRACTION Left   . CORONARY STENT PLACEMENT    . INGUINAL HERNIA REPAIR Right   . LIPOMA RESECTION Left    neck  . PENILE PROSTHESIS IMPLANT      There were no vitals filed for this visit.  Subjective Assessment - 04/19/18 1020    Subjective  A little bit of dizziness but no headaches    Currently in Pain?  Yes    Pain Score  3     Pain Location  -- all over                        [facility] Adult PT Treatment/Exercise - 04/19/18 0001      High Level Balance   High Level Balance Activities  Side stepping;Backward walking;Marching forwards;Marching backwards;Negotitating around obstacles;Negotiating over obstacles    High Level Balance Comments  forwaed and side stepping over foamroll. stepping over foam roll onto airex       Knee/Hip Exercises:  Aerobic   Recumbent Bike  L1 x 6 min    Other Aerobic  UBE L3 x4 min       Knee/Hip Exercises: Machines for Strengthening   Cybex Knee Flexion  25# 2x15    Cybex Leg Press  40#  3x10, then LLE 2x10 at 20#               PT Short Term Goals - 04/05/18 7510      PT SHORT TERM GOAL #1   Title  I with initial HEP    Status  Partially Met        PT Long Term Goals - 04/19/18 1056      PT LONG TERM GOAL #1   Title  Improved Berg Balance to 52/56 or better    Status  On-going      PT LONG TERM GOAL #2   Title  Patient to report no falls in past 2 months or more    Status  Partially Met      PT LONG TERM GOAL #3   Title  Improved TUG to 15 seconds     Status  On-going  PT LONG TERM GOAL #4   Title  Patient able to ambulate safely around building with [facility]    Status  On-going            Plan - 04/19/18 1056    Clinical Impression Statement  Overall pt did well with today's activities, he does have some posterior lean with non compliant surfaces. Cues to transfer wt forward when stepping over objects. Some LOB with side stepping requiring min assist at times to correct. Good strength with machine exercises.    Rehab Potential  Good    Clinical Impairments Affecting Rehab Potential  neuropathy, PTSD    PT Frequency  2x / week    PT Duration  12 weeks    PT Treatment/Interventions  ADLs/Self Care Home Management;Gait training;Neuromuscular re-education;Balance training;Therapeutic exercise;Patient/family education;Manual techniques    PT Next Visit Plan  balance/gait, progress with functional activities, BERG, TUG       Patient will benefit from skilled therapeutic intervention in order to improve the following deficits and impairments:  Abnormal gait, Decreased balance  Visit Diagnosis: Unsteadiness on feet     Problem List Patient Active Problem List   Diagnosis Date Noted  . Headache(784.0) 07/15/2013  . Abnormality of gait 02/28/2013  .  Obstructive sleep apnea (adult) (pediatric) 02/28/2013  . 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 Polyneuropathy in diabetes(357.2) 02/28/2013    Scot Jun, PTA 04/19/2018, 11:01 AM  [place] [address] [place], [place], [address] Phone: [phone]   Fax:  [phone]  Name: Richard Ruiz MRN: [id number] Date of Birth: August 22, 1946

## 2018-04-23 ENCOUNTER — Ambulatory Visit: Payer: Medicare Other | Admitting: Physical Therapy

## 2018-04-23 DIAGNOSIS — R2681 Unsteadiness on feet: Secondary | ICD-10-CM

## 2018-04-23 NOTE — Therapy (Signed)
[place], [place], [address] Phone: [phone]   Fax:  [phone]  Physical Therapy Treatment  Patient Details  Name: Richard Ruiz MRN: [id number] Date of Birth: Feb 13, 1946 Referring Provider: Gerilyn Nestle   Encounter Date: 04/23/2018  PT End of Session - 04/23/18 1223    Visit Number  7    Number of Visits  15    Date for PT Re-Evaluation  06/16/18    Authorization Type  VA - 15 visits authorized by 06/16/18    PT Start Time  1151    PT Stop Time  1230    PT Time Calculation (min)  39 min    Activity Tolerance  Patient tolerated treatment well    Behavior During Therapy  [facility] for tasks assessed/performed       Past Medical History:  Diagnosis Date  . CAD (coronary artery disease)   . Carpal tunnel syndrome    bilateral  . Cervical spondylosis without myelopathy   . Depression   . Diabetes mellitus without complication ([place])   . Dyslipidemia   . GERD (gastroesophageal reflux disease)   . Headache(784.0) 07/15/2013  . Hypothyroid   . Impotence   . Obesity   . OSA (obstructive sleep apnea)    on CPAP    Past Surgical History:  Procedure Laterality Date  . CATARACT EXTRACTION Left   . CORONARY STENT PLACEMENT    . INGUINAL HERNIA REPAIR Right   . LIPOMA RESECTION Left    neck  . PENILE PROSTHESIS IMPLANT      There were no vitals filed for this visit.  Subjective Assessment - 04/23/18 1153    Subjective  Pt reports doing well today but "hurting a bit all over."    Currently in Pain?  Yes    Pain Score  3     Pain Descriptors / Indicators  Other (Comment)    Pain Radiating Towards  "all over, chronic pain"                       [facility] Adult PT Treatment/Exercise - 04/23/18 0001      High Level Balance   High Level Balance Activities  Side stepping;Backward walking;Marching forwards;Marching backwards;Negotitating around obstacles;Negotiating over  obstacles    High Level Balance Comments  forwaed and side stepping over foamroll. stepping over foam roll onto airex       Knee/Hip Exercises: Aerobic   Recumbent Bike  L2 x 6 min    Other Aerobic  UBE L3 x6 min       Knee/Hip Exercises: Machines for Strengthening   Cybex Knee Extension  20# 2x15    Cybex Knee Flexion  25# 2x15    Cybex Leg Press  40#  3x10, then LLE 2x10 at 20#      Knee/Hip Exercises: Standing   Other Standing Knee Exercises  Alt 6in box taps 2x10 no UE support    Other Standing Knee Exercises  airex eyes closed 3x20 sec               PT Short Term Goals - 04/05/18 4010      PT SHORT TERM GOAL #1   Title  I with initial HEP    Status  Partially Met        PT Long Term Goals - 04/19/18 1056      PT LONG TERM GOAL #1   Title  Improved Berg Balance to 52/56 or better    Status  On-going      PT LONG TERM GOAL #2   Title  Patient to report no falls in past 2 months or more    Status  Partially Met      PT LONG TERM GOAL #3   Title  Improved TUG to 15 seconds     Status  On-going      PT LONG TERM GOAL #4   Title  Patient able to ambulate safely around building with [facility]    Status  On-going            Plan - 04/23/18 1226    Clinical Impression Statement  Pt was 6 minutes late to treatment. Pt tolerated treatment well. Pt performed negotiating obstacle at a higher level and tolerated an increase in total aerobic workload. Pt had slight LOB once during treatment but corrected and performed well on the close eyes balance exercise.     Rehab Potential  Good    Clinical Impairments Affecting Rehab Potential  neuropathy, PTSD    PT Frequency  2x / week    PT Duration  12 weeks    PT Treatment/Interventions  ADLs/Self Care Home Management;Gait training;Neuromuscular re-education;Balance training;Therapeutic exercise;Patient/family education;Manual techniques    PT Next Visit Plan  balance/gait, progress with functional activities, BERG, TUG     PT Home Exercise Plan  SLS, sit to stand, standing hip ABD, flex, ext with tband       Patient will benefit from skilled therapeutic intervention in order to improve the following deficits and impairments:  Abnormal gait, Decreased balance  Visit Diagnosis: Unsteadiness on feet     Problem List Patient Active Problem List   Diagnosis Date Noted  . Headache(784.0) 07/15/2013  . Abnormality of gait 02/28/2013  . Obstructive sleep apnea (adult) (pediatric) 02/28/2013  . 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 Polyneuropathy in diabetes(357.2) 02/28/2013    Maryelizabeth Kaufmann, SPTA 04/23/2018, 12:33 PM  [place] [address] [place], [place], [address] Phone: [phone]   Fax:  [phone]  Name: MIKEAL WINSTANLEY MRN: [id number] Date of Birth: 1945-10-03

## 2018-04-26 ENCOUNTER — Ambulatory Visit: Payer: Medicare Other | Attending: Internal Medicine | Admitting: Physical Therapy

## 2018-04-26 ENCOUNTER — Encounter: Payer: Self-pay | Admitting: Physical Therapy

## 2018-04-26 DIAGNOSIS — R2681 Unsteadiness on feet: Secondary | ICD-10-CM | POA: Insufficient documentation

## 2018-04-26 NOTE — Therapy (Signed)
[place], [place], [address] Phone: [phone]   Fax:  [phone]  Physical Therapy Treatment  Patient Details  Name: Richard Ruiz MRN: [id number] Date of Birth: 04/09/1946 Referring Provider: Gerilyn Nestle   Encounter Date: 04/26/2018  PT End of Session - 04/26/18 1222    Visit Number  8    Number of Visits  15    Date for PT Re-Evaluation  06/16/18    Authorization Type  VA - 15 visits authorized by 06/16/18    PT Start Time  2025    PT Stop Time  1228    PT Time Calculation (min)  43 min    Activity Tolerance  Patient tolerated treatment well    Behavior During Therapy  [facility] for tasks assessed/performed       Past Medical History:  Diagnosis Date  . CAD (coronary artery disease)   . Carpal tunnel syndrome    bilateral  . Cervical spondylosis without myelopathy   . Depression   . Diabetes mellitus without complication ([place])   . Dyslipidemia   . GERD (gastroesophageal reflux disease)   . Headache(784.0) 07/15/2013  . Hypothyroid   . Impotence   . Obesity   . OSA (obstructive sleep apnea)    on CPAP    Past Surgical History:  Procedure Laterality Date  . CATARACT EXTRACTION Left   . CORONARY STENT PLACEMENT    . INGUINAL HERNIA REPAIR Right   . LIPOMA RESECTION Left    neck  . PENILE PROSTHESIS IMPLANT      There were no vitals filed for this visit.  Subjective Assessment - 04/26/18 1148    Subjective  "Doing pretty good"    Currently in Pain?  Yes    Pain Score  3  all over         [facility] PT Assessment - 04/26/18 0001      Berg Balance Test   Sit to Stand  Able to stand without using hands and stabilize independently    Standing Unsupported  Able to stand safely 2 minutes    Sitting with Back Unsupported but Feet Supported on Floor or Stool  Able to sit safely and securely 2 minutes    Stand to Sit  Sits safely with minimal use of hands    Transfers  Able to  transfer safely, minor use of hands    Standing Unsupported with Eyes Closed  Able to stand 10 seconds safely    Standing Ubsupported with Feet Together  Able to place feet together independently and stand 1 minute safely    From Standing, Reach Forward with Outstretched Arm  Can reach confidently >25 cm (10")    From Standing Position, Pick up Object from Floor  Able to pick up shoe safely and easily    From Standing Position, Turn to Look Behind Over each Shoulder  Looks behind from both sides and weight shifts well    Turn 360 Degrees  Able to turn 360 degrees safely but slowly    Standing Unsupported, Alternately Place Feet on Step/Stool  Able to stand independently and safely and complete 8 steps in 20 seconds    Standing Unsupported, One Foot in Front  Able to take small step independently and hold 30 seconds    Standing on One Leg  Able to lift leg independently and hold 5-10 seconds    Total Score  51  Timed Up and Go Test   TUG  Normal TUG    Normal TUG (seconds)  10.55 Woith rollator                   [facility] Adult PT Treatment/Exercise - 04/26/18 0001      Knee/Hip Exercises: Aerobic   Recumbent Bike  L2 x 89mn    Nustep  L5 x7 min     Other Aerobic  UBE L3 x4 min       Knee/Hip Exercises: Machines for Strengthening   Cybex Leg Press  40#  3x10, then LLE 2x10 at 20#               PT Short Term Goals - 04/05/18 0958      PT SHORT TERM GOAL #1   Title  I with initial HEP    Status  Partially Met        PT Long Term Goals - 04/26/18 1219      PT LONG TERM GOAL #1   Title  Improved Berg Balance to 52/56 or better    Status  Partially Met            Plan - 04/26/18 1222    Clinical Impression Statement  Pt is progressing towards all goals and has met some. Pt was a little slow with 360 degree turns. Some instability with tandem standing and SLS during BURG balance test     Rehab Potential  Good    Clinical Impairments Affecting Rehab  Potential  neuropathy, PTSD    PT Treatment/Interventions  ADLs/Self Care Home Management;Gait training;Neuromuscular re-education;Balance training;Therapeutic exercise;Patient/family education;Manual techniques    PT Next Visit Plan  balance/gait, progress with functional activities       Patient will benefit from skilled therapeutic intervention in order to improve the following deficits and impairments:  Abnormal gait, Decreased balance  Visit Diagnosis: Unsteadiness on feet     Problem List Patient Active Problem List   Diagnosis Date Noted  . Headache(784.0) 07/15/2013  . Abnormality of gait 02/28/2013  . Obstructive sleep apnea (adult) (pediatric) 02/28/2013  . 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 Polyneuropathy in diabetes(357.2) 02/28/2013    RScot Jun PTA 04/26/2018, 12:25 PM  [address] [address]Phone: [phone]  Fax:  [phone] Name: Richard MAZERMRN: [id number] of Birth: 111-28-1947

## 2018-04-30 ENCOUNTER — Encounter: Payer: Self-pay | Admitting: Physical Therapy

## 2018-04-30 ENCOUNTER — Ambulatory Visit: Payer: Medicare Other | Admitting: Physical Therapy

## 2018-04-30 DIAGNOSIS — R2681 Unsteadiness on feet: Secondary | ICD-10-CM

## 2018-04-30 NOTE — Therapy (Signed)
[place], [place], [address] Phone: [phone]   Fax:  [phone]  Physical Therapy Treatment  Patient Details  Name: Richard Ruiz MRN: [id number] Date of Birth: [birth year] Referring Provider: Gerilyn Nestle   Encounter Date: 04/30/2018  PT End of Session - 04/30/18 1055    Visit Number  9    Date for PT Re-Evaluation  06/16/18    PT Start Time  1012    PT Stop Time  1056    PT Time Calculation (min)  44 min    Activity Tolerance  Patient tolerated treatment well    Behavior During Therapy  [facility] for tasks assessed/performed       Past Medical History:  Diagnosis Date  . CAD (coronary artery disease)   . Carpal tunnel syndrome    bilateral  . Cervical spondylosis without myelopathy   . Depression   . Diabetes mellitus without complication ([place])   . Dyslipidemia   . GERD (gastroesophageal reflux disease)   . Headache(784.0) 07/15/2013  . Hypothyroid   . Impotence   . Obesity   . OSA (obstructive sleep apnea)    on CPAP    Past Surgical History:  Procedure Laterality Date  . CATARACT EXTRACTION Left   . CORONARY STENT PLACEMENT    . INGUINAL HERNIA REPAIR Right   . LIPOMA RESECTION Left    neck  . PENILE PROSTHESIS IMPLANT      There were no vitals filed for this visit.  Subjective Assessment - 04/30/18 1009    Subjective   "doing good"    Currently in Pain?  Yes    Pain Score  3     Pain Location  -- all over                       [facility] Adult PT Treatment/Exercise - 04/30/18 0001      High Level Balance   High Level Balance Activities  Side stepping;Negotiating over obstacles      Knee/Hip Exercises: Aerobic   Nustep  L5 x7 min     Other Aerobic  UBE L3 x4 min       Knee/Hip Exercises: Machines for Strengthening   Cybex Knee Extension  20# 2x15    Cybex Knee Flexion  25# 2x15    Cybex Leg Press  50#  3x10, then LLE 2x10 at 30#      Knee/Hip  Exercises: Standing   Other Standing Knee Exercises  Alt 6in box taps xy, latral 6in box taps x10 each    Other Standing Knee Exercises  resisted gait 40lb f4 way x3       Knee/Hip Exercises: Seated   Sit to Sand  2 sets;10 reps;without UE support LE on airex               PT Short Term Goals - 04/05/18 5809      PT SHORT TERM GOAL #1   Title  I with initial HEP    Status  Partially Met        PT Long Term Goals - 04/26/18 1219      PT LONG TERM GOAL #1   Title  Improved Berg Balance to 52/56 or better    Status  Partially Met            Plan - 04/30/18 1056    Clinical Impression Statement  Pt continues to do well  in therapy. He demos good strength and ROM on all machine level exercise. Some instability noted with balance activities. Some anterior lean with sit to stands with LE on Airex. Also some instability when stepping over foam roll.     Rehab Potential  Good    Clinical Impairments Affecting Rehab Potential  neuropathy, PTSD    PT Frequency  2x / week    PT Duration  12 weeks    PT Treatment/Interventions  ADLs/Self Care Home Management;Gait training;Neuromuscular re-education;Balance training;Therapeutic exercise;Patient/family education;Manual techniques    PT Next Visit Plan  balance/gait, progress with functional activities       Patient will benefit from skilled therapeutic intervention in order to improve the following deficits and impairments:  Abnormal gait, Decreased balance  Visit Diagnosis: Unsteadiness on feet     Problem List Patient Active Problem List   Diagnosis Date Noted  . Headache(784.0) 07/15/2013  . Abnormality of gait 02/28/2013  . Obstructive sleep apnea (adult) (pediatric) 02/28/2013  . 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 Polyneuropathy in diabetes(357.2) 02/28/2013    Scot Jun, PTA 04/30/2018, 10:58 AM  [place], [place], [address] Phone: [phone]   Fax:  [phone]  Name: Richard Ruiz MRN: [id number] Date of Birth: 1945/11/30

## 2018-05-03 ENCOUNTER — Ambulatory Visit: Payer: Medicare Other | Admitting: Physical Therapy

## 2018-05-03 ENCOUNTER — Encounter: Payer: Self-pay | Admitting: Physical Therapy

## 2018-05-03 DIAGNOSIS — R2681 Unsteadiness on feet: Secondary | ICD-10-CM | POA: Diagnosis not present

## 2018-05-03 NOTE — Therapy (Signed)
[place] [address] [place] [address] [place], [place], [address] Phone: [phone]   Fax:  [phone]  Physical Therapy Treatment  Patient Details  Name: Richard Ruiz MRN: [id number] Date of Birth: [birth year] Referring Provider: Gerilyn Nestle   Encounter Date: 05/03/2018  PT End of Session - 05/03/18 1054    Visit Number  10    Number of Visits  15    Date for PT Re-Evaluation  06/16/18    Authorization Type  VA - 15 visits authorized by 06/16/18    PT Start Time  1015    PT Stop Time  1100    PT Time Calculation (min)  45 min       Past Medical History:  Diagnosis Date  . CAD (coronary artery disease)   . Carpal tunnel syndrome    bilateral  . Cervical spondylosis without myelopathy   . Depression   . Diabetes mellitus without complication ([place])   . Dyslipidemia   . GERD (gastroesophageal reflux disease)   . Headache(784.0) 07/15/2013  . Hypothyroid   . Impotence   . Obesity   . OSA (obstructive sleep apnea)    on CPAP    Past Surgical History:  Procedure Laterality Date  . CATARACT EXTRACTION Left   . CORONARY STENT PLACEMENT    . INGUINAL HERNIA REPAIR Right   . LIPOMA RESECTION Left    neck  . PENILE PROSTHESIS IMPLANT      There were no vitals filed for this visit.  Subjective Assessment - 05/03/18 1024    Subjective  "Feeling good"    Currently in Pain?  Yes    Pain Score  4                        [facility] Adult PT Treatment/Exercise - 05/03/18 0001      Knee/Hip Exercises: Aerobic   Nustep  L5 x7 min       Knee/Hip Exercises: Machines for Strengthening   Cybex Knee Extension  10lb 2x10 LLE    Cybex Knee Flexion  LLE 20lb 2x10     Cybex Leg Press  60# 3x10, then LLE 2x10 at 40#      Knee/Hip Exercises: Standing   Other Standing Knee Exercises  Sit to stand blue chair airex under LE 2x10     Other Standing Knee Exercises  resisted gait 40lb 4 way x5                           Plan - 05/03/18 1055    Clinical Impression Statement  Pt demo ed good SL LLE strength on all machines tolerating increased resistance. Some instability with resisted side step with the resistance on his L side. Come clearance issues with the 8 inch step up. Ambulated around clinic two times with [facility]  S assist needed.    Rehab Potential  Good    Clinical Impairments Affecting Rehab Potential  neuropathy, PTSD    PT Treatment/Interventions  ADLs/Self Care Home Management;Gait training;Neuromuscular re-education;Balance training;Therapeutic exercise;Patient/family education;Manual techniques    PT Next Visit Plan  balance/gait, progress with functional activities       Patient will benefit from skilled therapeutic intervention in order to improve the following deficits and impairments:  Abnormal gait, Decreased balance  Visit Diagnosis: Unsteadiness on feet     Problem List Patient Active Problem List   Diagnosis Date Noted  .  Headache(784.0) 07/15/2013  . Abnormality of gait 02/28/2013  . Obstructive sleep apnea (adult) (pediatric) 02/28/2013  . 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 Polyneuropathy in diabetes(357.2) 02/28/2013    Scot Jun, PTA 05/03/2018, 10:58 AM  [place], [place], [address] Phone: [phone]   Fax:  [phone]  Name: Richard Ruiz MRN: [id number] Date of Birth: 03-07-1946

## 2018-05-08 ENCOUNTER — Encounter: Payer: Self-pay | Admitting: Physical Therapy

## 2018-05-08 ENCOUNTER — Ambulatory Visit: Payer: Medicare Other | Admitting: Physical Therapy

## 2018-05-08 DIAGNOSIS — R2681 Unsteadiness on feet: Secondary | ICD-10-CM | POA: Diagnosis not present

## 2018-05-08 NOTE — Therapy (Signed)
[place], [place], [address] Phone: [phone]   Fax:  [phone]  Physical Therapy Treatment  Patient Details  Name: Richard Ruiz MRN: [id number] Date of Birth: [birth year] Referring Provider: Gerilyn Nestle   Encounter Date: 05/08/2018  PT End of Session - 05/08/18 1423    Visit Number  11    Date for PT Re-Evaluation  06/16/18    Authorization Type  VA - 15 visits authorized by 06/16/18    PT Start Time  1344    PT Stop Time  1425    PT Time Calculation (min)  41 min    Activity Tolerance  Patient tolerated treatment well    Behavior During Therapy  [facility] for tasks assessed/performed       Past Medical History:  Diagnosis Date  . CAD (coronary artery disease)   . Carpal tunnel syndrome    bilateral  . Cervical spondylosis without myelopathy   . Depression   . Diabetes mellitus without complication ([place])   . Dyslipidemia   . GERD (gastroesophageal reflux disease)   . Headache(784.0) 07/15/2013  . Hypothyroid   . Impotence   . Obesity   . OSA (obstructive sleep apnea)    on CPAP    Past Surgical History:  Procedure Laterality Date  . CATARACT EXTRACTION Left   . CORONARY STENT PLACEMENT    . INGUINAL HERNIA REPAIR Right   . LIPOMA RESECTION Left    neck  . PENILE PROSTHESIS IMPLANT      There were no vitals filed for this visit.  Subjective Assessment - 05/08/18 1351    Subjective  "Feeling good"    Currently in Pain?  Yes    Pain Score  3    all over                      [facility] Adult PT Treatment/Exercise - 05/08/18 0001      High Level Balance   High Level Balance Comments  sode stepp in over foam rolls       Knee/Hip Exercises: Aerobic   Nustep  L5 x7 min     Other Aerobic  UBE L3 x4 min       Knee/Hip Exercises: Machines for Strengthening   Cybex Knee Extension  15lb 2x1 LLE    Cybex Knee Flexion  LLE 20lb 2x1    Cybex Leg Press  60#  3x10, then LLE 2x10 at 40#      Knee/Hip Exercises: Standing   Other Standing Knee Exercises  Sit to stand blue chair airex under LE 2x10; Fron and lateral alt toe taps 8in then 8in toe taps on airex               PT Short Term Goals - 04/05/18 9242      PT SHORT TERM GOAL #1   Title  I with initial HEP    Status  Partially Met        PT Long Term Goals - 05/08/18 1355      PT LONG TERM GOAL #1   Title  Improved Berg Balance to 52/56 or better    Status  Partially Met      PT LONG TERM GOAL #2   Title  Patient to report no falls in past 2 months or more    Status  Partially Met      PT LONG TERM GOAL #3  Title  Improved TUG to 15 seconds     Status  Achieved            Plan - 05/08/18 1425    Clinical Impression Statement  No falls or issues reported at home. Some instability with lateral side step over foam roll but he did well overall. Pt able to maintain balance on airex and he did from and lateral toe taps on eight inch box. D=Good strength demo ed on machine interventions.    Clinical Impairments Affecting Rehab Potential  neuropathy, PTSD    PT Frequency  2x / week    PT Duration  12 weeks    PT Treatment/Interventions  ADLs/Self Care Home Management;Gait training;Neuromuscular re-education;Balance training;Therapeutic exercise;Patient/family education;Manual techniques    PT Next Visit Plan  balance/gait, progress with functional activities       Patient will benefit from skilled therapeutic intervention in order to improve the following deficits and impairments:  Abnormal gait, Decreased balance  Visit Diagnosis: Unsteadiness on feet     Problem List Patient Active Problem List   Diagnosis Date Noted  . Headache(784.0) 07/15/2013  . Abnormality of gait 02/28/2013  . Obstructive sleep apnea (adult) (pediatric) 02/28/2013  . 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 Polyneuropathy in diabetes(357.2) 02/28/2013    Scot Jun, PTA 05/08/2018, 2:26 PM  [place], [place], [address] Phone: [phone]   Fax:  [phone]  Name: SEKOU ZUCKERMAN MRN: [id number] Date of Birth: June 14, 1946

## 2018-05-10 ENCOUNTER — Encounter: Payer: Self-pay | Admitting: Physical Therapy

## 2018-05-10 ENCOUNTER — Ambulatory Visit: Payer: Medicare Other | Admitting: Physical Therapy

## 2018-05-10 DIAGNOSIS — R2681 Unsteadiness on feet: Secondary | ICD-10-CM | POA: Diagnosis not present

## 2018-05-10 NOTE — Therapy (Signed)
[place] [address] [address] [place], [place], [address] Phone: [phone]   Fax:  [phone]  Physical Therapy Treatment  Patient Details  Name: Richard Ruiz MRN: [id number] Date of Birth: Jun 23, 1946 Referring Provider: Gerilyn Nestle   Encounter Date: 05/10/2018  PT End of Session - 05/10/18 1459    Visit Number  12    Number of Visits  15    Date for PT Re-Evaluation  06/16/18    PT Start Time  1448    PT Stop Time  1535    PT Time Calculation (min)  47 min       Past Medical History:  Diagnosis Date  . CAD (coronary artery disease)   . Carpal tunnel syndrome    bilateral  . Cervical spondylosis without myelopathy   . Depression   . Diabetes mellitus without complication ([place])   . Dyslipidemia   . GERD (gastroesophageal reflux disease)   . Headache(784.0) 07/15/2013  . Hypothyroid   . Impotence   . Obesity   . OSA (obstructive sleep apnea)    on CPAP    Past Surgical History:  Procedure Laterality Date  . CATARACT EXTRACTION Left   . CORONARY STENT PLACEMENT    . INGUINAL HERNIA REPAIR Right   . LIPOMA RESECTION Left    neck  . PENILE PROSTHESIS IMPLANT      There were no vitals filed for this visit.  Subjective Assessment - 05/10/18 1449    Subjective  all over fibro pain 3/10 ,denies any falls/stumbles    Currently in Pain?  Yes    Pain Score  3                        [facility] Adult PT Treatment/Exercise - 05/10/18 0001      Exercises   Exercises  Knee/Hip      Knee/Hip Exercises: Aerobic   Nustep  L5 x7 min     Other Aerobic  UBE L3 x4 min       Knee/Hip Exercises: Machines for Strengthening   Cybex Knee Extension  15 # 15 times BIL, 10# SL 10 each    Cybex Knee Flexion  25# 15 DL, 20# 10 SL    Cybex Leg Press  60# 3x10, then LLE 2x10 at 40#      Knee/Hip Exercises: Standing   Walking with Sports Cord  40#. 4 ways x 5 ea way    Other Standing Knee Exercises  alt  step tap 8 inch 20 x 2 sets   8 inch step up 10 times each   Other Standing Knee Exercises  cable press down 40# 20 each      Knee/Hip Exercises: Seated   Sit to Sand  2 sets;10 reps;without UE support   blue chair on airex              PT Short Term Goals - 04/05/18 8110      PT SHORT TERM GOAL #1   Title  I with initial HEP    Status  Partially Met        PT Long Term Goals - 05/10/18 1500      PT LONG TERM GOAL #1   Title  Improved Berg Balance to 52/56 or better    Status  Partially Met      PT LONG TERM GOAL #2   Title  Patient to report no falls in  past 2 months or more    Status  Achieved      PT LONG TERM GOAL #3   Title  Improved TUG to 15 seconds     Status  Achieved      PT LONG TERM GOAL #4   Title  Patient able to ambulate safely around building with [facility]    Baseline  NOT A GOAL OF Patient    Status  Deferred            Plan - 05/10/18 1511    Clinical Impression Statement  tolerated all interventions well, minimal LOB but able to regain with no help. progressing with goals,deferred SPC goal as pt wants to stay with RW. Discussed ind. gym program with pt for aftere D/C.    PT Treatment/Interventions  ADLs/Self Care Home Management;Gait training;Neuromuscular re-education;Balance training;Therapeutic exercise;Patient/family education;Manual techniques    PT Next Visit Plan  balance/gait, progress with functional activities- 3 more visits then D/C       Patient will benefit from skilled therapeutic intervention in order to improve the following deficits and impairments:  Abnormal gait, Decreased balance  Visit Diagnosis: Unsteadiness on feet     Problem List Patient Active Problem List   Diagnosis Date Noted  . Headache(784.0) 07/15/2013  . Abnormality of gait 02/28/2013  . Obstructive sleep apnea (adult) (pediatric) 02/28/2013  . Type II or unspecified type diabetes mellitus with neurological manifestations, not stated as  uncontrolled(250.60) 02/28/2013  . Polyneuropathy in diabetes(357.2) 02/28/2013    PAYSEUR,ANGIE PTA 05/10/2018, 3:26 PM  [place] [address] [address] [place], [place], [address] Phone: [phone]   Fax:  [phone]  Name: Richard Ruiz MRN: [id number] Date of Birth: August 12, 1946

## 2018-05-15 ENCOUNTER — Ambulatory Visit: Payer: Medicare Other | Admitting: Physical Therapy

## 2018-05-15 ENCOUNTER — Encounter: Payer: Self-pay | Admitting: Physical Therapy

## 2018-05-15 DIAGNOSIS — R2681 Unsteadiness on feet: Secondary | ICD-10-CM

## 2018-05-15 NOTE — Therapy (Signed)
[place], [place], [address] Phone: [phone]   Fax:  [phone]  Physical Therapy Treatment  Patient Details  Name: Richard Ruiz MRN: [id number] Date of Birth: Dec 30, 1945 Referring Provider: Gerilyn Nestle   Encounter Date: 05/15/2018  PT End of Session - 05/15/18 1050    Visit Number  13    Number of Visits  15    Date for PT Re-Evaluation  06/16/18    Authorization Type  VA - 15 visits authorized by 06/16/18    PT Start Time  1010    PT Stop Time  1050    PT Time Calculation (min)  40 min    Activity Tolerance  Patient tolerated treatment well    Behavior During Therapy  [facility] for tasks assessed/performed       Past Medical History:  Diagnosis Date  . CAD (coronary artery disease)   . Carpal tunnel syndrome    bilateral  . Cervical spondylosis without myelopathy   . Depression   . Diabetes mellitus without complication ([place])   . Dyslipidemia   . GERD (gastroesophageal reflux disease)   . Headache(784.0) 07/15/2013  . Hypothyroid   . Impotence   . Obesity   . OSA (obstructive sleep apnea)    on CPAP    Past Surgical History:  Procedure Laterality Date  . CATARACT EXTRACTION Left   . CORONARY STENT PLACEMENT    . INGUINAL HERNIA REPAIR Right   . LIPOMA RESECTION Left    neck  . PENILE PROSTHESIS IMPLANT      There were no vitals filed for this visit.  Subjective Assessment - 05/15/18 1012    Subjective  "Pretty good"    Currently in Pain?  Yes    Pain Score  4    all over                      [facility] Adult PT Treatment/Exercise - 05/15/18 0001      Ambulation/Gait   Ambulation/Gait  Yes    Ambulation/Gait Assistance  5: Supervision    Ambulation Distance (Feet)  --   >200 ft   Assistive device  Straight cane    Gait Pattern  Within Functional Limits    Ambulation Surface  Indoor;Outdoor;Unlevel;Paved    Stairs  Yes    Stairs Assistance  5:  Supervision    Stair Management Technique  One rail Right;Alternating pattern    Number of Stairs  24    Height of Stairs  6    Gait Comments  amulated ouside around front island; one flight of stairs cues to [employer] alt pattern, reprot some lightheaddness at the top of stairs       Knee/Hip Exercises: Aerobic   Nustep  L6 x6 min     Other Aerobic  UBE L6 x 3 min each way        Knee/Hip Exercises: Standing   Walking with Sports Cord  40#. 4 ways x 5 ea way      Knee/Hip Exercises: Seated   Sit to Sand  2 sets;10 reps;without UE support   from blue chair holding 4lb               PT Short Term Goals - 04/05/18 3267      PT SHORT TERM GOAL #1   Title  I with initial HEP    Status  Partially Met  PT Long Term Goals - 05/10/18 1500      PT LONG TERM GOAL #1   Title  Improved Berg Balance to 52/56 or better    Status  Partially Met      PT LONG TERM GOAL #2   Title  Patient to report no falls in past 2 months or more    Status  Achieved      PT LONG TERM GOAL #3   Title  Improved TUG to 15 seconds     Status  Achieved      PT LONG TERM GOAL #4   Title  Patient able to ambulate safely around building with [facility]    Baseline  NOT A GOAL OF Patient    Status  Deferred            Plan - 05/15/18 1050    Clinical Impression Statement  Progressed to some more functional interventions. Pt reports that's he felt lightheaded at the to of the steps but recovered quick. A few bots of balance loss with outdoor ambulation with SPC but able to recover. Cues to control weight and take bigger steps with resisted gait during backwards walking.    Rehab Potential  Good    Clinical Impairments Affecting Rehab Potential  neuropathy, PTSD    PT Frequency  2x / week    PT Duration  12 weeks    PT Next Visit Plan  balance/gait, progress with functional activities- 3 more visits then D/C       Patient will benefit from skilled therapeutic intervention in order to  improve the following deficits and impairments:  Abnormal gait, Decreased balance  Visit Diagnosis: Unsteadiness on feet     Problem List Patient Active Problem List   Diagnosis Date Noted  . Headache(784.0) 07/15/2013  . Abnormality of gait 02/28/2013  . Obstructive sleep apnea (adult) (pediatric) 02/28/2013  . 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 Polyneuropathy in diabetes(357.2) 02/28/2013    Scot Jun, PTA 05/15/2018, 10:52 AM  [place], [place], [address] Phone: [phone]   Fax:  [phone]  Name: Richard Ruiz MRN: [id number] Date of Birth: 07/16/1946

## 2018-05-18 ENCOUNTER — Encounter: Payer: Self-pay | Admitting: Physical Therapy

## 2018-05-18 ENCOUNTER — Ambulatory Visit: Payer: Medicare Other | Admitting: Physical Therapy

## 2018-05-18 DIAGNOSIS — R2681 Unsteadiness on feet: Secondary | ICD-10-CM | POA: Diagnosis not present

## 2018-05-18 NOTE — Therapy (Signed)
[place], [place], [address] Phone: [phone]   Fax:  [phone]  Physical Therapy Treatment  Patient Details  Name: Richard Ruiz MRN: [id number] Date of Birth: [birth year] Referring Provider: Gerilyn Nestle   Encounter Date: 05/18/2018  PT End of Session - 05/18/18 1133    Visit Number  14    Date for PT Re-Evaluation  06/16/18    Authorization Type  VA - 15 visits authorized by 06/16/18    PT Start Time  1100    PT Stop Time  1135    PT Time Calculation (min)  35 min    Activity Tolerance  Patient tolerated treatment well    Behavior During Therapy  [facility] for tasks assessed/performed       Past Medical History:  Diagnosis Date  . CAD (coronary artery disease)   . Carpal tunnel syndrome    bilateral  . Cervical spondylosis without myelopathy   . Depression   . Diabetes mellitus without complication ([place])   . Dyslipidemia   . GERD (gastroesophageal reflux disease)   . Headache(784.0) 07/15/2013  . Hypothyroid   . Impotence   . Obesity   . OSA (obstructive sleep apnea)    on CPAP    Past Surgical History:  Procedure Laterality Date  . CATARACT EXTRACTION Left   . CORONARY STENT PLACEMENT    . INGUINAL HERNIA REPAIR Right   . LIPOMA RESECTION Left    neck  . PENILE PROSTHESIS IMPLANT      There were no vitals filed for this visit.  Subjective Assessment - 05/18/18 1105    Subjective  "Doing great"    Currently in Pain?  Yes    Pain Score  3    all over                      [facility] Adult PT Treatment/Exercise - 05/18/18 0001      Ambulation/Gait   Ambulation/Gait  Yes    Ambulation/Gait Assistance  5: Supervision    Assistive device  Straight cane    Gait Pattern  Within Functional Limits    Ambulation Surface  Outdoor;Paved;Unlevel    Gait Comments  one lap around building      Knee/Hip Exercises: Aerobic   Nustep  L6 x6 min     Other Aerobic   UBE L6 x 3 min each way        Knee/Hip Exercises: Machines for Strengthening   Cybex Knee Extension  15lb 2x15     Cybex Knee Flexion  35lb 2x15    Cybex Leg Press  60# 3x10               PT Short Term Goals - 04/05/18 5631      PT SHORT TERM GOAL #1   Title  I with initial HEP    Status  Partially Met        PT Long Term Goals - 05/18/18 1134      PT LONG TERM GOAL #1   Title  Improved Berg Balance to 52/56 or better    Status  Partially Met      PT LONG TERM GOAL #2   Status  Achieved      PT LONG TERM GOAL #3   Title  Improved TUG to 15 seconds     Status  Achieved      PT LONG TERM GOAL #4  Title  Patient able to ambulate safely around building with [facility]    Status  Achieved            Plan - 05/18/18 1134    Clinical Impression Statement  LTG's mostly met pt is pleased with current functional status. No issues with today's activities.    Rehab Potential  Good    Clinical Impairments Affecting Rehab Potential  neuropathy, PTSD    PT Treatment/Interventions  ADLs/Self Care Home Management;Gait training;Neuromuscular re-education;Balance training;Therapeutic exercise;Patient/family education;Manual techniques    PT Next Visit Plan  D/C PT       Patient will benefit from skilled therapeutic intervention in order to improve the following deficits and impairments:  Abnormal gait, Decreased balance  Visit Diagnosis: Unsteadiness on feet     Problem List Patient Active Problem List   Diagnosis Date Noted  . Headache(784.0) 07/15/2013  . Abnormality of gait 02/28/2013  . Obstructive sleep apnea (adult) (pediatric) 02/28/2013  . 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 Polyneuropathy in diabetes(357.2) 02/28/2013   PHYSICAL THERAPY DISCHARGE SUMMARY  Visits from Start of Care: 14 Plan: Patient agrees to discharge.  Patient goals were partially met. Patient  is being discharged due to being pleased with the current functional level.  ?????     Scot Jun, PTA 05/18/2018, 11:36 AM  [place] [address] [place], [place], [address] Phone: [phone]   Fax:  [phone]  Name: Richard Ruiz MRN: [id number] Date of Birth: 12/18/1945

## 2022-08-15 ENCOUNTER — Ambulatory Visit: Payer: Self-pay | Admitting: Surgery

## 2022-08-15 DIAGNOSIS — E669 Obesity, unspecified: Secondary | ICD-10-CM | POA: Diagnosis present

## 2022-08-15 DIAGNOSIS — Z9182 Personal history of military deployment: Secondary | ICD-10-CM

## 2022-08-15 DIAGNOSIS — F4312 Post-traumatic stress disorder, chronic: Secondary | ICD-10-CM

## 2022-08-15 DIAGNOSIS — I2510 Atherosclerotic heart disease of native coronary artery without angina pectoris: Secondary | ICD-10-CM | POA: Diagnosis present

## 2022-08-15 DIAGNOSIS — G43909 Migraine, unspecified, not intractable, without status migrainosus: Secondary | ICD-10-CM | POA: Insufficient documentation

## 2022-08-15 DIAGNOSIS — I10 Essential (primary) hypertension: Secondary | ICD-10-CM | POA: Diagnosis present

## 2022-08-15 NOTE — H&P (Signed)
Subjective   Chief Complaint: New Consultation (Umbilical hernia)     History of Present Illness: Richard Ruiz is a 76 y.o. male who is seen today as an office consultation at the request of the [facility] for evaluation of New Consultation (Umbilical hernia) .    This is a 76 year old male who is referred to [place] from the [facility] for evaluation of an umbilical hernia.  [facility] [place] [facility]  The patient reports noticing a small umbilical hernia for at least 8 years.  This has become larger and more uncomfortable.  It remains reducible.  He denies any obstructive symptoms.  He denies any previous surgery in this area. In 2010, he had a right inguinal hernia repair with mesh by Dr. HFanny Skates  The patient reports a coronary stent placement in 2011.  He no longer sees a cardiologist.  [facility] [place] [facility] [place] [facility] [place]  He is on Ozempic and a baby aspirin. Review of Systems: A complete review of systems was obtained from the patient.  I have reviewed this information and discussed as appropriate with the patient.  See HPI as well for other ROS.  Review of Systems  Constitutional: Negative.   HENT:  Positive for hearing loss and tinnitus.   Eyes:  Positive for blurred vision.  Respiratory:  Positive for shortness of breath.   Cardiovascular: Negative.   Gastrointestinal: Negative.   Genitourinary: Negative.   Musculoskeletal:  Positive for neck pain.  Skin: Negative.   Neurological:  Positive for dizziness, weakness and headaches.  Endo/Heme/Allergies:  Bruises/bleeds easily.  Psychiatric/Behavioral:  Positive for depression and memory loss. The patient is nervous/anxious.       Medical History: Past Medical History:  Diagnosis Date   Anxiety    Diabetes mellitus without complication (CMS-HCC)    GERD (gastroesophageal reflux disease)    Hypertension    Liver disease    Sleep apnea    Thyroid disease     Patient Active Problem  List  Diagnosis   Chronic post-traumatic stress disorder (PTSD) after military combat   Age-related nuclear cataract, right eye   Asymmetric SNHL (sensorineural hearing loss)   Coronary artery disease   Diabetes mellitus (CMS-HCC)   Essential hypertension   Migraine   Obesity   Obstructive sleep apnea (adult) (pediatric)   Umbilical hernia without obstruction or gangrene    Past Surgical History:  Procedure Laterality Date   eye surgery     HERNIA REPAIR     Penile prosthesis implant     [phone]    No Known Allergies  Current Outpatient Medications on File Prior to Visit  Medication Sig Dispense Refill   artificial tears (GENTEAL) 0.3 % ophthalmic gel APPLY 1 DROP IN EACH EYE AT BEDTIME     aspirin 81 MG EC tablet TAKE ONE TABLET BY MOUTH ONCE EVERY DAY TAKE EACH DOSE WITH A MEAL.     atorvastatin (LIPITOR) 80 MG tablet TAKE ONE-HALF TABLET BY MOUTH AT BEDTIME FOR HIGH CHOLESTEROL FOR CHOLESTEROL     carboxymethylcellulose sodium 1 % Drop APPLY 1 DROP IN EACH EYE FOUR TIMES A DAY FOR DRY EYE(S)     cetirizine (ZYRTEC) 10 MG tablet Take 1 tablet by mouth once daily as needed     cholecalciferol (VITAMIN D3) 1000 unit tablet TAKE TWO TABLETS BY MOUTH EVERY DAY FOR VITAMIN D DEFICIENCY     DULoxetine (CYMBALTA) 30 MG DR capsule  TAKE THREE CAPSULES BY MOUTH EVERY DAY FOR DIABETIC COMPLICATION     empagliflozin (JARDIANCE) 25 mg tablet TAKE ONE TABLET BY MOUTH ONCE EVERY DAY FOR TYPE 2 DIABETES MELLITUS FOR DIABETES     gabapentin (NEURONTIN) 600 MG tablet TAKE ONE TABLET BY MOUTH THREE TIMES A DAY FOR NEUROPATHIC PAIN FOR NERVE PAIN     isosorbide mononitrate (IMDUR) 60 MG ER tablet TAKE ONE TABLET BY MOUTH ONCE EVERY DAY FOR PREVENT ANGINAL CHEST PAIN FOR HEART (DO NOT TAKE CIALIS, VIAGRA OR LEVITRA WHILE YOU ARE ON THIS MEDICATION).     levothyroxine (SYNTHROID) 150 MCG tablet TAKE ONE TABLET BY MOUTH EVERY DAY FOR HYPOTHYROIDISM ; TAKE AT LEAST 45 MINUTES PRIOR TO EATING  WITH ONLY WATER AND NO OTHER MULTIVITAMINS OR MEDICATIONS.     losartan (COZAAR) 100 MG tablet TAKE ONE TABLET BY MOUTH EVERY DAY FOR HIGH BLOOD PRESSURE FOR BLOOD PRESSURE     metFORMIN (GLUCOPHAGE) 1000 MG tablet TAKE ONE TABLET BY MOUTH TWO TIMES A DAY FOR PREVENTION OF TYPE 2 DIABETES FOR DIABETES     metoprolol succinate (TOPROL-XL) 50 MG XL tablet TAKE ONE TABLET BY MOUTH EVERY DAY FOR HEART AND BLOOD PRESSURE     multivitamin tablet Take 1 tablet by mouth once daily     nitroGLYcerin (NITROSTAT) 0.4 MG SL tablet DISSOLVE ONE TABLET UNDER THE TONGUE AS DIRECTED FOR CHEST PAIN. REPEAT EVERY 5 MINUTES UP TO 3 DOSES. IF NO RELIEF SEEK MEDICAL HELP.(DO NOT TAKE CIALIS, VIAGRA OR LEVITRA WHILE YOU ARE ON THIS MEDICATION ).     olopatadine (PATANOL) 0.1 % ophthalmic solution INSTILL 1 DROP IN EACH EYE EVERY DAY EYE ITCHING AS NEEDED     omeprazole (PRILOSEC) 40 MG DR capsule TAKE ONE CAPSULE BY MOUTH EVERY DAY FOR HEARTBURN TO CONTROL STOMACH ACID.  TAKE 30 MINUTES BEFORE A MEAL     pioglitazone (ACTOS) 15 MG tablet TAKE ONE TABLET BY MOUTH EVERY DAY FOR DIABETES     No current facility-administered medications on file prior to visit.    History reviewed. No pertinent family history.   Social History   Tobacco Use  Smoking Status Every Day   Packs/day: .5   Types: Cigarettes  Smokeless Tobacco Never     Social History   Socioeconomic History   Marital status: Married  Tobacco Use   Smoking status: Every Day    Packs/day: .5    Types: Cigarettes   Smokeless tobacco: Never  Vaping Use   Vaping Use: Never used  Substance and Sexual Activity   Alcohol use: Not Currently   Drug use: Never    Objective:    Vitals:   08/15/22 0902  BP: 122/78  Pulse: 78  SpO2: 96%  Weight: 87.5 kg (192 lb 12.8 oz)  Height: 170.2 cm ('5\' 7"'$ )    Body mass index is 30.2 kg/m.  Physical Exam   Constitutional:  WDWN in NAD, conversant, no obvious deformities; lying in bed comfortably Eyes:   Pupils equal, round; sclera anicteric; moist conjunctiva; no lid lag HENT:  Oral mucosa moist; good dentition  Neck:  No masses palpated, trachea midline; no thyromegaly Lungs:  CTA bilaterally; normal respiratory effort CV:  Regular rate and rhythm; no murmurs; extremities well-perfused with no edema Abd:  +bowel sounds, soft, non-tender, no palpable organomegaly; protruding umbilical hernia that is reducible.  The fascial defect is 2 cm in diameter. Musc: The patient ambulates with the assistance of a cane. no apparent clubbing or cyanosis in  extremities Lymphatic:  No palpable cervical or axillary lymphadenopathy Skin:  Warm, dry; no sign of jaundice Psychiatric - alert and oriented x 4; calm mood and affect    Assessment and Plan:  Diagnoses and all orders for this visit:  Umbilical hernia without obstruction or gangrene  Umbilical hernia repair with mesh.The surgical procedure has been discussed with the patient.  Potential risks, benefits, alternative treatments, and expected outcomes have been explained.  All of the patient's questions at this time have been answered.  The likelihood of reaching the patient's treatment goal is good.  The patient understand the proposed surgical procedure and wishes to proceed.   Hold Ozempic for 2 weeks prior to surgery.  Hold aspirin 5 days prior to surgery.  No follow-ups on file.  Ayisha Pol Jearld Adjutant, MD  08/15/2022 9:22 AM

## 2022-09-15 NOTE — Progress Notes (Signed)
Surgical Instructions    Your procedure is scheduled on Thursday January 4th.  Report to [facility] Main Entrance "A" at 5:30 A.M., then check in with the Admitting office.  Call this number if you have problems the morning of surgery:  [phone]   If you have any questions prior to your surgery date call [phone]: Open Monday-Friday 8am-4pm If you experience any cold or flu symptoms such as cough, fever, chills, shortness of breath, etc. between now and your scheduled surgery, please notify us at the above number     Remember:  Do not eat after midnight the night before your surgery  You may drink clear liquids until 430am the morning of your surgery.   Clear liquids allowed are: Water, Non-Citrus Juices (without pulp), Carbonated Beverages, Clear Tea, Black Coffee ONLY (NO MILK, CREAM OR POWDERED CREAMER of any kind), and Gatorade    Take these medicines the morning of surgery with A SIP OF WATER: Carboxymethylcellulose Sod PF 1 % GEL  cetirizine (ZYRTEC) 10 MG tablet  DULoxetine (CYMBALTA) 30 MG capsule  gabapentin (NEURONTIN) 600 MG tablet  isosorbide mononitrate (IMDUR) 60 MG 24 hr tablet  levothyroxine (SYNTHROID, LEVOTHROID) 150 MCG tablet  metoprolol succinate (TOPROL-XL) 100 MG 24 hr tablet  omeprazole (PRILOSEC) 40 MG capsule    IF NEEDED  acetaminophen (TYLENOL) 500 MG tablet  fluticasone (FLONASE) 50 MCG/ACT nasal spray  nitroGLYCERIN (NITROSTAT) 0.4 MG SL tablet    Follow your surgeon's instructions on when to stop Aspirin.  If no instructions were given by your surgeon then you will need to call the office to get those instructions.    As of today, STOP taking any Aspirin (unless otherwise instructed by your surgeon) Aleve, Naproxen, Ibuprofen, Motrin, Advil, Goody's, BC's, all herbal medications, fish oil, and all vitamins.  WHAT DO I DO ABOUT MY DIABETES MEDICATION?   Do not take oral diabetes medicines (Metformin and Actos) the morning of  surgery.  HOLD your JARDIANCE for 72 hours (3 days) prior to surgery  HOLD your [facility] for 7 days prior to surgery   The day of surgery, do not take other diabetes injectables, including Byetta (exenatide), Bydureon (exenatide ER), Victoza (liraglutide), or Trulicity (dulaglutide).   HOW TO MANAGE YOUR DIABETES BEFORE AND AFTER SURGERY  Why is it important to control my blood sugar before and after surgery? Improving blood sugar levels before and after surgery helps healing and can limit problems. A way of improving blood sugar control is eating a healthy diet by:  Eating less sugar and carbohydrates  Increasing activity/exercise  Talking with your doctor about reaching your blood sugar goals High blood sugars (greater than 180 mg/dL) can raise your risk of infections and slow your recovery, so you will need to focus on controlling your diabetes during the weeks before surgery. Make sure that the doctor who takes care of your diabetes knows about your planned surgery including the date and location.  How do I manage my blood sugar before surgery? Check your blood sugar at least 4 times a day, starting 2 days before surgery, to make sure that the level is not too high or low.  Check your blood sugar the morning of your surgery when you wake up and every 2 hours until you get to the Short Stay unit.  If your blood sugar is less than 70 mg/dL, you will need to treat for low blood sugar: Do not take insulin. Treat a low blood sugar (less than 70 mg/dL) with  cup of clear juice (cranberry or apple), 4 glucose tablets, OR glucose gel. Recheck blood sugar in 15 minutes after treatment (to make sure it is greater than 70 mg/dL). If your blood sugar is not greater than 70 mg/dL on recheck, call [phone] for further instructions. Report your blood sugar to the short stay nurse when you get to Short Stay.  If you are admitted to the hospital after surgery: Your blood sugar will be  checked by the staff and you will probably be given insulin after surgery (instead of oral diabetes medicines) to make sure you have good blood sugar levels. The goal for blood sugar control after surgery is 80-180 mg/dL.      Do not wear jewelry . Do not wear lotions, powders, cologne or deodorant. Do not shave 48 hours prior to surgery.  Men may shave face and neck. Do not bring valuables to the hospital. Do not wear nail polish  [facility] is not responsible for any belongings or valuables.    Do NOT Smoke (Tobacco/Vaping)  24 hours prior to your procedure  If you use a CPAP at night, you may bring your mask for your overnight stay.   Contacts, glasses, hearing aids, dentures or partials may not be worn into surgery, please bring cases for these belongings   For patients admitted to the hospital, discharge time will be determined by your treatment team.   Patients discharged the day of surgery will not be allowed to drive home, and someone needs to stay with them for 24 hours.   SURGICAL WAITING ROOM VISITATION Patients having surgery or a procedure may have no more than 2 support people in the waiting area - these visitors may rotate.   Children under the age of 52 must have an adult with them who is not the patient. If the patient needs to stay at the hospital during part of their recovery, the visitor guidelines for inpatient rooms apply. Pre-op nurse will coordinate an appropriate time for 1 support person to accompany patient in pre-op.  This support person may not rotate.   Please refer to [email or web address] for the visitor guidelines for Inpatients (after your surgery is over and you are in a regular room).    Special instructions:    Oral Hygiene is also important to reduce your risk of infection.  Remember - BRUSH YOUR TEETH THE MORNING OF SURGERY WITH YOUR REGULAR TOOTHPASTE   [facility]- Preparing For  Surgery  Before surgery, you can play an important role. Because skin is not sterile, your skin needs to be as free of germs as possible. You can reduce the number of germs on your skin by washing with CHG (chlorahexidine gluconate) Soap before surgery.  CHG is an antiseptic cleaner which kills germs and bonds with the skin to continue killing germs even after washing.     Please do not use if you have an allergy to CHG or antibacterial soaps. If your skin becomes reddened/irritated stop using the CHG.  Do not shave (including legs and underarms) for at least 48 hours prior to first CHG shower. It is OK to shave your face.  Please follow these instructions carefully.     Shower the NIGHT BEFORE SURGERY and the MORNING OF SURGERY with CHG Soap.   If you chose to wash your hair, wash your hair first as usual with your normal shampoo. After you shampoo, rinse your hair and body thoroughly to remove the shampoo.  Then [employer] and  genitals (private parts) with your normal soap and rinse thoroughly to remove soap.  After that Use CHG Soap as you would any other liquid soap. You can apply CHG directly to the skin and wash gently with a scrungie or a clean washcloth.   Apply the CHG Soap to your body ONLY FROM THE NECK DOWN.  Do not use on open wounds or open sores. Avoid contact with your eyes, ears, mouth and genitals (private parts). Wash Face and genitals (private parts)  with your normal soap.   Wash thoroughly, paying special attention to the area where your surgery will be performed.  Thoroughly rinse your body with warm water from the neck down.  DO NOT shower/wash with your normal soap after using and rinsing off the CHG Soap.  Pat yourself dry with a CLEAN TOWEL.  Wear CLEAN PAJAMAS to bed the night before surgery  Place CLEAN SHEETS on your bed the night before your surgery  DO NOT SLEEP WITH PETS.   Day of Surgery:  Take a shower with CHG soap. Wear Clean/Comfortable  clothing the morning of surgery Do not apply any deodorants/lotions.   Remember to brush your teeth WITH YOUR REGULAR TOOTHPASTE.    If you received a COVID test during your pre-op visit, it is requested that you wear a mask when out in public, stay away from anyone that may not be feeling well, and notify your surgeon if you develop symptoms. If you have been in contact with anyone that has tested positive in the last 10 days, please notify your surgeon.    Please read over the following fact sheets that you were given.

## 2022-09-16 ENCOUNTER — Encounter (HOSPITAL_COMMUNITY): Payer: Self-pay

## 2022-09-16 ENCOUNTER — Other Ambulatory Visit: Payer: Self-pay

## 2022-09-16 ENCOUNTER — Encounter (HOSPITAL_COMMUNITY): Payer: Self-pay | Admitting: Vascular Surgery

## 2022-09-16 ENCOUNTER — Encounter (HOSPITAL_COMMUNITY)
Admission: RE | Admit: 2022-09-16 | Discharge: 2022-09-16 | Disposition: A | Discharge status: Home / Self Care | Payer: No Typology Code available for payment source | Source: Ambulatory Visit | Attending: Surgery | Admitting: Surgery

## 2022-09-16 VITALS — BP 144/75 | Temp 97.4°F | Resp 18 | Ht 67.0 in | Wt 202.5 lb

## 2022-09-16 DIAGNOSIS — I2510 Atherosclerotic heart disease of native coronary artery without angina pectoris: Secondary | ICD-10-CM | POA: Diagnosis not present

## 2022-09-16 DIAGNOSIS — R001 Bradycardia, unspecified: Secondary | ICD-10-CM | POA: Diagnosis not present

## 2022-09-16 DIAGNOSIS — E039 Hypothyroidism, unspecified: Secondary | ICD-10-CM | POA: Diagnosis not present

## 2022-09-16 DIAGNOSIS — E785 Hyperlipidemia, unspecified: Secondary | ICD-10-CM | POA: Insufficient documentation

## 2022-09-16 DIAGNOSIS — K429 Umbilical hernia without obstruction or gangrene: Secondary | ICD-10-CM | POA: Insufficient documentation

## 2022-09-16 DIAGNOSIS — Z01818 Encounter for other preprocedural examination: Secondary | ICD-10-CM | POA: Diagnosis not present

## 2022-09-16 DIAGNOSIS — G4733 Obstructive sleep apnea (adult) (pediatric): Secondary | ICD-10-CM | POA: Diagnosis not present

## 2022-09-16 DIAGNOSIS — F32A Depression, unspecified: Secondary | ICD-10-CM | POA: Insufficient documentation

## 2022-09-16 DIAGNOSIS — K219 Gastro-esophageal reflux disease without esophagitis: Secondary | ICD-10-CM | POA: Insufficient documentation

## 2022-09-16 DIAGNOSIS — Z87891 Personal history of nicotine dependence: Secondary | ICD-10-CM | POA: Insufficient documentation

## 2022-09-16 DIAGNOSIS — E119 Type 2 diabetes mellitus without complications: Secondary | ICD-10-CM | POA: Diagnosis not present

## 2022-09-16 LAB — BASIC METABOLIC PANEL
Anion gap: 9 (ref 5–15)
BUN: 14 mg/dL (ref 8–23)
CO2: 28 mmol/L (ref 22–32)
Calcium: 9.5 mg/dL (ref 8.9–10.3)
Chloride: 98 mmol/L (ref 98–111)
Creatinine, Ser: 1.07 mg/dL (ref 0.61–1.24)
GFR, Estimated: >60 mL/min (ref >60)
Glucose, Bld: 227 mg/dL — ABNORMAL HIGH (ref 70–99)
Potassium: 4.2 mmol/L (ref 3.5–5.1)
Sodium: 135 mmol/L (ref 135–145)

## 2022-09-16 LAB — CBC
HCT: 45.4 % (ref 39.0–52.0)
Hemoglobin: 15.5 g/dL (ref 13.0–17.0)
MCH: 32.4 pg (ref 26.0–34.0)
MCHC: 34.1 g/dL (ref 30.0–36.0)
MCV: 95 fL (ref 80.0–100.0)
Platelets: 294 10*3/uL (ref 150–400)
RBC: 4.78 MIL/uL (ref 4.22–5.81)
RDW: 12.7 % (ref 11.5–15.5)
WBC: 7.6 10*3/uL (ref 4.0–10.5)
nRBC: 0 % (ref 0.0–0.2)

## 2022-09-16 LAB — GLUCOSE, CAPILLARY: Glucose-Capillary: 257 mg/dL — ABNORMAL HIGH (ref 70–99)

## 2022-09-16 NOTE — Progress Notes (Signed)
Anesthesia APP Evaluation:  Case: [id number] Date/Time: 09/29/22 0715   Procedure: UMBILICAL HERNIA REPAIR WITH MESH   Anesthesia type: General   Pre-op diagnosis: UMBILICAL HERNIA   Location: [facility] OR ROOM 09 / [place] OR   Surgeons: Donnie Mesa, MD       DISCUSSION: Patient is a 76 year old male scheduled for the above procedure.  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 Patient seen in Ortho clinic: R distal femur lesion: Repeat XR showed stable size with rec for f/u in 1 year or PRN.")   I evaluated Mr. Richard Ruiz during his PAT visit. Wife was present. We all wore face masks. He reported 1 cardiac stent in 2012 with another vessel showing 40% stenosis. He has not seen a cardiologist in many years. He believes he may have had an echocardiogram three years ago per his [place] PCP. He receives primary care through the [facility], last visit with Dr. Tawni Pummel on 06/29/22. She referred him to general surgery for his umbilical hernia, but he was referred out due to long wait times for surgery. She does monitor his CAD and provides refills of his cardiac medications. She notes he has stable angina. He tells me he has to take Nitro about every 1-2 months when he does more strenuous activity such as mulching his yard. He will notice some chest tightness. He has not had to take more than one Nitro at a time. The chest tightness is different from the chest pain he had when he required a stent. He says he has had to take intermittent Nitro for several years and denied any escalation of his symptoms or amount taken.  He does have chronic dyspnea on exertion with  strenuous activity only.  He denies edema, palpitations, orthopnea, syncope.  He denies known history of CHF or arrhythmia.  He does not have a routine exercise regimen but at least once a month does yard work which can involve mulching, weed eating, mowing (riding mower).   Cardiac medication includes ASA 81 mg, atorvastatin 40 mg, losartan 100 mg daily, metoprolol succinate 30m, and Imdur 60 mg daily, as needed Nitro 0.4 mg SL. He reported instructions to hold ASA for 1 week prior to surgery.   [facility] Dr[facility] RDrema [place] A1c 9.1% on 06/29/22. At 09/09/22 visit with EKris Hartmann PharmD, BCACP, there was discussion about starting insulin glargine 10 units daily, but he says he is not on this. Current DM medications include pioglitazone 166mdaily, semaglutide 69m77meekly (Wednesdays), empagliflozin 30m20mily, and metformin 1000mg46m. We discussed that a significantly elevated CBG or A1c could result in need to postpone surgery. A1c is still pending. Dr. TsueiGeorgette Doverructed him to hold Ozempic for 2 weeks prior to surgery, last dose 09/09/22.   Given known CAD with stable angina he will need preoperative cardiology evaluation. We discussed that this could affect timing of his surgery. I did [facility], and she will work on getting a referral initiated. I also notified triage nurse NicolElmyra RicksCS. LortonDDENDUM 09/20/22 9:40 AM:  I routed PAT lab results including A1c 9.3% to Dr. TsueiGeorgette Dover  also sent communication regarding need for preoperative cardiology evaluation. (UPDATE 09/22/22 11:44 AM: Cardiology appointment with the [facility] is scheduled for 11/04/22. Surgery to be postponed.)   VS: BP (!) 144/75   Temp (!) 36.3 C (Oral)   Resp 18   Ht _0  (1.702 m)   Wt 91.9 kg   SpO2 99%   BMI 31.72 kg/m    PROVIDERS: Wenda Low, MD is local [facility]) PCP, but reported last visit was ~ 2008.  Crist Fat, MD (resident)/[place], Jillyn Ledger, MD (attending) is [place] PCP. Last visit noted is from 06/29/22. Phone numbers: [phone], ext [phone] or Clinic [phone].   LABS: Preoperative labs noted. A1c is in process. [place] labs on 05/16/22 showed alk phos 101, AST 20, ALT 32. (all labs ordered are listed, but only abnormal results are displayed)  Labs Reviewed  GLUCOSE, CAPILLARY - Abnormal; Notable for the following components:      Result Value   Glucose-Capillary 257 (*)    All other components within normal limits  BASIC METABOLIC PANEL - Abnormal; Notable for the following components:   Glucose, Bld 227 (*)    All other components within normal limits  CBC  HEMOGLOBIN A1C    EKG: 09/16/22:  Sinus bradycardia at 58 bpm Nonspecific T wave abnormality Abnormal ECG   CV: No records available for review.    Past Medical History:  Diagnosis Date   CAD (coronary artery disease)    Carpal tunnel syndrome    bilateral   Cervical spondylosis without myelopathy    Depression    Diabetes mellitus without complication (HCC)    Dyslipidemia    GERD (gastroesophageal reflux disease)    Headache(784.0) 07/15/2013   Hypothyroid    Impotence    Obesity    OSA (obstructive sleep apnea)    on CPAP    Past Surgical History:  Procedure Laterality Date   CATARACT EXTRACTION Left    CORONARY STENT PLACEMENT     INGUINAL HERNIA REPAIR Right    LIPOMA RESECTION Left    neck   PENILE PROSTHESIS IMPLANT      MEDICATIONS:  acetaminophen (TYLENOL) 500 MG tablet   aspirin EC 81 MG tablet   atorvastatin (LIPITOR) 80 MG tablet   Carboxymethylcellulose Sod PF 1 % GEL   cetirizine (ZYRTEC) 10 MG tablet   DULoxetine (CYMBALTA) 30 MG capsule   empagliflozin (JARDIANCE) 25 MG TABS tablet   fluticasone (FLONASE) 50 MCG/ACT nasal spray   gabapentin (NEURONTIN) 600 MG tablet   isosorbide mononitrate (IMDUR) 60 MG 24 hr tablet   levothyroxine (SYNTHROID, LEVOTHROID) 150 MCG tablet    losartan (COZAAR) 100 MG tablet   metFORMIN (GLUCOPHAGE) 1000 MG tablet   metoprolol succinate (TOPROL-XL) 100 MG 24 hr tablet   Multiple Vitamin (MULTIVITAMIN) tablet   nitroGLYCERIN (NITROSTAT) 0.4 MG SL tablet   omeprazole (PRILOSEC) 40 MG capsule   pioglitazone (ACTOS) 15 MG tablet   SEMAGLUTIDE, 1 MG/DOSE, [place]   vitamin D3 (CHOLECALCIFEROL) 25 MCG tablet   No current facility-administered medications for this encounter.    Myra Gianotti, PA-C Surgical Short Stay/Anesthesiology [facility] Phone [phone] [facility] Phone [phone] 09/16/2022 7:32 PM

## 2022-09-16 NOTE — Progress Notes (Addendum)
[facility] [place] [facility] Dr. Tawni Pummel Cardiologist - Saw someone in 2012 from the [place] cannot recall name, had stent placed with heart cath.  PPM/ICD - Denies  Chest x-ray - NI EKG - 09/16/22 [facility] ECHO - Yes with the [place]  Cardiac Cath - 2012 one stent placed per patient  Sleep Study - Has OSA CPAP - nightly  DM - Type II CBG @ PAT appt 257 fasting Fasting Blood Sugar - 245-265 Checks Blood Sugar __3___ times a week  Last dose of GLP1 agonist-  Ozempic GLP1 instructions: last dose 09/09/22  Blood Thinner Instructions:Denies Aspirin Instructions:Per patient to hold one week prior  ERAS Protcol -Yes  COVID TEST- NI   Anesthesia review: Yes cardiac history  Patient denies shortness of breath, fever, cough and chest pain at PAT appointment   All instructions explained to the patient, with a verbal understanding of the material. Patient agrees to go over the instructions while at home for a better understanding.  The opportunity to ask questions was provided.

## 2022-09-17 LAB — HEMOGLOBIN A1C
Hgb A1c MFr Bld: 9.3 % — ABNORMAL HIGH (ref 4.8–5.6)
Mean Plasma Glucose: 220 mg/dL

## 2022-09-29 ENCOUNTER — Ambulatory Visit (HOSPITAL_COMMUNITY)
Admission: RE | Admit: 2022-09-29 | Payer: No Typology Code available for payment source | Source: Home / Self Care | Admitting: Surgery

## 2022-09-29 ENCOUNTER — Encounter (HOSPITAL_COMMUNITY): Admission: RE | Payer: Self-pay | Source: Home / Self Care

## 2022-09-29 SURGERY — REPAIR, HERNIA, UMBILICAL, ADULT
Anesthesia: General

## 2023-12-13 ENCOUNTER — Encounter (HOSPITAL_BASED_OUTPATIENT_CLINIC_OR_DEPARTMENT_OTHER): Payer: Self-pay | Admitting: Emergency Medicine

## 2023-12-13 ENCOUNTER — Emergency Department (HOSPITAL_BASED_OUTPATIENT_CLINIC_OR_DEPARTMENT_OTHER)
Admission: EM | Admit: 2023-12-13 | Discharge: 2023-12-13 | Disposition: A | Discharge status: Home / Self Care | Attending: Emergency Medicine | Admitting: Emergency Medicine

## 2023-12-13 ENCOUNTER — Other Ambulatory Visit: Payer: Self-pay

## 2023-12-13 DIAGNOSIS — B3741 Candidal cystitis and urethritis: Secondary | ICD-10-CM

## 2023-12-13 DIAGNOSIS — R300 Dysuria: Secondary | ICD-10-CM | POA: Diagnosis present

## 2023-12-13 DIAGNOSIS — Z7982 Long term (current) use of aspirin: Secondary | ICD-10-CM | POA: Insufficient documentation

## 2023-12-13 DIAGNOSIS — N390 Urinary tract infection, site not specified: Secondary | ICD-10-CM | POA: Diagnosis not present

## 2023-12-13 LAB — URINALYSIS, W/ REFLEX TO CULTURE (INFECTION SUSPECTED)
Bilirubin Urine: NEGATIVE
Glucose, UA: >=500 mg/dL — AB
Hgb urine dipstick: NEGATIVE
Ketones, ur: NEGATIVE mg/dL
Nitrite: NEGATIVE
Protein, ur: NEGATIVE mg/dL
Specific Gravity, Urine: 1.02 (ref 1.005–1.030)
WBC, UA: >50 WBC/hpf (ref 0–5)
pH: 5.5 (ref 5.0–8.0)

## 2023-12-13 MED ORDER — CEPHALEXIN 500 MG PO CAPS: 500.0000 mg | ORAL_CAPSULE | Freq: Two times a day (BID) | ORAL | 0 refills | Status: AC

## 2023-12-13 MED ORDER — FLUCONAZOLE 200 MG PO TABS: 200.0000 mg | ORAL_TABLET | Freq: Every day | ORAL | 0 refills | Status: AC

## 2023-12-13 NOTE — ED Provider Notes (Signed)
 [place] [facility] AT [facility] Provider Note   CSN: [id number] Arrival date & time: 12/13/23  1003     History  Chief Complaint  Patient presents with   Dysuria    Richard Ruiz is a 78 y.o. male.   Dysuria Presenting symptoms: dysuria      78 year old male presenting to the emergency department for chief complaint of dysuria.  The patient states that he has had dysuria for the past few days.  He was seen at urgent care and had an urine culture collected on 3/17 which resulted on 3/18 positive for yeast.  He was advised to present to the Emergency Department for further evaluation.  He denies any flank pain, he denies any abdominal pain.  He endorses persistent dysuria.  No fevers or chills.  Home Medications Prior to Admission medications   Medication Sig Start Date End Date Taking? Authorizing Provider  cephALEXin (KEFLEX) 500 MG capsule Take 1 capsule (500 mg total) by mouth 2 (two) times daily for 5 days. 12/13/23 12/18/23 Yes Ernie Avena, MD  fluconazole (DIFLUCAN) 200 MG tablet Take 1 tablet (200 mg total) by mouth daily for 14 days. 12/13/23 12/27/23 Yes Ernie Avena, MD  acetaminophen (TYLENOL) 500 MG tablet Take 500-1,000 mg by mouth every 6 (six) hours as needed (headache/pain.).    [provider]  aspirin EC 81 MG tablet Take 81 mg by mouth in the morning.    [provider]  atorvastatin (LIPITOR) 80 MG tablet Take 40 mg by mouth at bedtime.    [provider]  Carboxymethylcellulose Sod PF 1 % GEL Place 1 drop into both eyes in the morning, at noon, in the evening, and at bedtime.    [provider]  cetirizine (ZYRTEC) 10 MG tablet Take 10 mg by mouth in the morning. cholecacif    [provider]  DULoxetine (CYMBALTA) 30 MG capsule Take 90 mg by mouth in the morning.    [provider]  empagliflozin (JARDIANCE) 25 MG TABS tablet Take 25 mg by mouth in the morning.    [provider]  fluticasone (FLONASE) 50 MCG/ACT nasal spray Place 1 spray into both nostrils daily as needed (nasal congestion.). 02/10/13   [provider]  gabapentin (NEURONTIN) 600 MG tablet Take 600-1,200 mg by mouth See admin instructions. Take 2 tablets (1200 mg) by mouth in the morning & take 1 tablet (600 mg) by mouth at night.    [provider]  isosorbide mononitrate (IMDUR) 60 MG 24 hr tablet Take 60 mg by mouth in the morning.    [provider]  levothyroxine (SYNTHROID, LEVOTHROID) 150 MCG tablet Take 150 mcg by mouth daily before breakfast.    [provider]  losartan (COZAAR) 100 MG tablet Take 100 mg by mouth in the morning. 12/06/13   [provider]  metFORMIN (GLUCOPHAGE) 1000 MG tablet Take 1,000 mg by mouth 2 (two) times daily. 02/13/13   [provider]  metoprolol succinate (TOPROL-XL) 100 MG 24 hr tablet Take 100 mg by mouth in the morning.    [provider]  Multiple Vitamin (MULTIVITAMIN) tablet Take 1 tablet by mouth in the morning.    [provider]  nitroGLYCERIN (NITROSTAT) 0.4 MG SL tablet Place 0.4 mg under the tongue every 5 (five) minutes x 3 doses as needed for chest pain.    [provider]  omeprazole (PRILOSEC) 40 MG capsule Take 40 mg by mouth daily before  breakfast.    [provider]  pioglitazone (ACTOS) 15 MG tablet Take 15 mg by mouth in the morning.    [provider]  SEMAGLUTIDE, 1 MG/DOSE, [place] Inject 2 mg into the skin every Wednesday.    [provider]  vitamin D3 (CHOLECALCIFEROL) 25 MCG tablet Take 2,000 Units by mouth at bedtime.    [provider]      Allergies    Codeine, Gabapentin, and Lisinopril    Review of Systems   Review of Systems  Genitourinary:  Positive for dysuria.  All other systems reviewed and are negative.   Physical Exam Updated Vital Signs BP 123/79 (BP Location: Right Arm)   Pulse 100   Temp (!)  97.5 F (36.4 C) (Oral)   Resp 16   Ht 5\' 7"  (1.702 m)   Wt 86.2 kg   SpO2 93%   BMI 29.76 kg/m  Physical Exam Vitals and nursing note reviewed.  Constitutional:      General: He is not in acute distress. HENT:     Head: Normocephalic and atraumatic.  Eyes:     Conjunctiva/sclera: Conjunctivae normal.     Pupils: Pupils are equal, round, and reactive to light.  Cardiovascular:     Rate and Rhythm: Normal rate and regular rhythm.  Pulmonary:     Effort: Pulmonary effort is normal. No respiratory distress.  Abdominal:     General: There is no distension.     Tenderness: There is no abdominal tenderness. There is no right CVA tenderness, left CVA tenderness or guarding.  Musculoskeletal:        General: No deformity or signs of injury.     Cervical back: Neck supple.  Skin:    Findings: No lesion or rash.  Neurological:     General: No focal deficit present.     Mental Status: He is alert. Mental status is at baseline.     ED Results / Procedures / Treatments   Labs (all labs ordered are listed, but only abnormal results are displayed) Labs Reviewed  URINALYSIS, W/ REFLEX TO CULTURE (INFECTION SUSPECTED) - Abnormal; Notable for the following components:      Result Value   APPearance CLOUDY (*)    Glucose, UA >=500 (*)    Leukocytes,Ua SMALL (*)    Bacteria, UA FEW (*)    All other components within normal limits  URINE CULTURE    EKG None  Radiology No results found.  Procedures Procedures    Medications Ordered in ED Medications - No data to display  ED Course/ Medical Decision Making/ A&P                                 Medical Decision Making   78 year old male presenting to the emergency department for chief complaint of dysuria.  The patient states that he has had dysuria for the past few days.  He was seen at urgent care and had an urine culture collected on 3/17 which resulted on 3/18 positive for yeast.  He was advised to present to the  Emergency Department for further evaluation.  He denies any flank pain, he denies any abdominal pain.  He endorses persistent dysuria.  No fevers or chills.  On arrival, the patient was vitally stable, afebrile, not tachycardic or tachypneic, hemodynamically stable, saturating well on room air.  Urine culture results (collected 3/17):   Urine culture positive for Candida.  Will treat as the patient is symptomatic with a course of fluconazole for 2 weeks.  Repeat urinalysis collected today with a pending urine culture revealed small leukocytes, greater than 50 WBCs and few bacteria present, will also treat with a course of Keflex.  Return precautions provided, patient not septic, low concern for pyelonephritis or other intra-abdominal abnormality.  Follow-up advised with his PCP to ensure resolution, return precautions provided.    Final Clinical Impression(s) / ED Diagnoses Final diagnoses:  Lower urinary tract infectious disease  Candida cystitis    Rx / DC Orders ED Discharge Orders          Ordered    fluconazole (DIFLUCAN) 200 MG tablet  Daily        12/13/23 1348    cephALEXin (KEFLEX) 500 MG capsule  2 times daily        12/13/23 1348              Ernie Avena, MD 12/13/23 1348

## 2023-12-13 NOTE — Discharge Instructions (Addendum)
 Your urine culture grew out Candida for which we will trap to treat with a course of Diflucan for 2 weeks.  Follow-up with your PCP to ensure resolution.  He also did have bacteria in your urine today for which we will treat with a 5-day course of Keflex.  Return for any persistent symptoms, concern for development of fever, chills, severe flank pain concerning for developing kidney infection.

## 2023-12-13 NOTE — ED Triage Notes (Signed)
 Patient reports he was seen at [facility] yesterday for urinary sx. Patient received a call stating + dx of uti and was sent here for treatment. States they told him he would need IV ABX.

## 2023-12-14 LAB — URINE CULTURE

## 2024-03-02 ENCOUNTER — Inpatient Hospital Stay (HOSPITAL_BASED_OUTPATIENT_CLINIC_OR_DEPARTMENT_OTHER)
Admission: EM | Admit: 2024-03-02 | Discharge: 2024-03-06 | DRG: 689 | Disposition: A | Discharge status: Home Health | Attending: Internal Medicine | Admitting: Internal Medicine

## 2024-03-02 ENCOUNTER — Encounter (HOSPITAL_BASED_OUTPATIENT_CLINIC_OR_DEPARTMENT_OTHER): Payer: Self-pay | Admitting: Emergency Medicine

## 2024-03-02 ENCOUNTER — Other Ambulatory Visit: Payer: Self-pay

## 2024-03-02 DIAGNOSIS — E669 Obesity, unspecified: Secondary | ICD-10-CM | POA: Diagnosis present

## 2024-03-02 DIAGNOSIS — Z1152 Encounter for screening for COVID-19: Secondary | ICD-10-CM

## 2024-03-02 DIAGNOSIS — N390 Urinary tract infection, site not specified: Secondary | ICD-10-CM | POA: Diagnosis not present

## 2024-03-02 DIAGNOSIS — K219 Gastro-esophageal reflux disease without esophagitis: Secondary | ICD-10-CM | POA: Diagnosis present

## 2024-03-02 DIAGNOSIS — I10 Essential (primary) hypertension: Secondary | ICD-10-CM | POA: Diagnosis present

## 2024-03-02 DIAGNOSIS — R262 Difficulty in walking, not elsewhere classified: Secondary | ICD-10-CM | POA: Diagnosis present

## 2024-03-02 DIAGNOSIS — E538 Deficiency of other specified B group vitamins: Secondary | ICD-10-CM | POA: Diagnosis present

## 2024-03-02 DIAGNOSIS — E876 Hypokalemia: Secondary | ICD-10-CM | POA: Diagnosis present

## 2024-03-02 DIAGNOSIS — E119 Type 2 diabetes mellitus without complications: Secondary | ICD-10-CM

## 2024-03-02 DIAGNOSIS — Z87891 Personal history of nicotine dependence: Secondary | ICD-10-CM

## 2024-03-02 DIAGNOSIS — R531 Weakness: Secondary | ICD-10-CM | POA: Diagnosis not present

## 2024-03-02 DIAGNOSIS — Z6831 Body mass index (BMI) 31.0-31.9, adult: Secondary | ICD-10-CM

## 2024-03-02 DIAGNOSIS — I2510 Atherosclerotic heart disease of native coronary artery without angina pectoris: Secondary | ICD-10-CM | POA: Diagnosis present

## 2024-03-02 DIAGNOSIS — Z885 Allergy status to narcotic agent status: Secondary | ICD-10-CM

## 2024-03-02 DIAGNOSIS — Z7982 Long term (current) use of aspirin: Secondary | ICD-10-CM

## 2024-03-02 DIAGNOSIS — E039 Hypothyroidism, unspecified: Secondary | ICD-10-CM | POA: Diagnosis present

## 2024-03-02 DIAGNOSIS — Z7984 Long term (current) use of oral hypoglycemic drugs: Secondary | ICD-10-CM

## 2024-03-02 DIAGNOSIS — Z7985 Long-term (current) use of injectable non-insulin antidiabetic drugs: Secondary | ICD-10-CM

## 2024-03-02 DIAGNOSIS — Z955 Presence of coronary angioplasty implant and graft: Secondary | ICD-10-CM

## 2024-03-02 DIAGNOSIS — G43909 Migraine, unspecified, not intractable, without status migrainosus: Secondary | ICD-10-CM | POA: Diagnosis present

## 2024-03-02 DIAGNOSIS — E782 Mixed hyperlipidemia: Secondary | ICD-10-CM | POA: Diagnosis present

## 2024-03-02 DIAGNOSIS — Z9182 Personal history of military deployment: Secondary | ICD-10-CM

## 2024-03-02 DIAGNOSIS — K760 Fatty (change of) liver, not elsewhere classified: Secondary | ICD-10-CM | POA: Diagnosis present

## 2024-03-02 DIAGNOSIS — W19XXXA Unspecified fall, initial encounter: Secondary | ICD-10-CM | POA: Diagnosis present

## 2024-03-02 DIAGNOSIS — H25811 Combined forms of age-related cataract, right eye: Secondary | ICD-10-CM | POA: Diagnosis present

## 2024-03-02 DIAGNOSIS — Z888 Allergy status to other drugs, medicaments and biological substances status: Secondary | ICD-10-CM

## 2024-03-02 DIAGNOSIS — Z79899 Other long term (current) drug therapy: Secondary | ICD-10-CM

## 2024-03-02 DIAGNOSIS — G4733 Obstructive sleep apnea (adult) (pediatric): Secondary | ICD-10-CM | POA: Diagnosis present

## 2024-03-02 DIAGNOSIS — E1165 Type 2 diabetes mellitus with hyperglycemia: Secondary | ICD-10-CM | POA: Diagnosis present

## 2024-03-02 DIAGNOSIS — E0781 Sick-euthyroid syndrome: Secondary | ICD-10-CM | POA: Diagnosis present

## 2024-03-02 DIAGNOSIS — N400 Enlarged prostate without lower urinary tract symptoms: Secondary | ICD-10-CM | POA: Diagnosis present

## 2024-03-02 DIAGNOSIS — Z79890 Hormone replacement therapy (postmenopausal): Secondary | ICD-10-CM

## 2024-03-02 DIAGNOSIS — G5603 Carpal tunnel syndrome, bilateral upper limbs: Secondary | ICD-10-CM | POA: Diagnosis present

## 2024-03-02 DIAGNOSIS — Z87440 Personal history of urinary (tract) infections: Secondary | ICD-10-CM

## 2024-03-02 DIAGNOSIS — F32A Depression, unspecified: Secondary | ICD-10-CM | POA: Diagnosis present

## 2024-03-02 DIAGNOSIS — E1136 Type 2 diabetes mellitus with diabetic cataract: Secondary | ICD-10-CM | POA: Diagnosis present

## 2024-03-02 DIAGNOSIS — E1142 Type 2 diabetes mellitus with diabetic polyneuropathy: Secondary | ICD-10-CM | POA: Diagnosis present

## 2024-03-02 DIAGNOSIS — G9341 Metabolic encephalopathy: Secondary | ICD-10-CM | POA: Diagnosis present

## 2024-03-02 DIAGNOSIS — F4312 Post-traumatic stress disorder, chronic: Secondary | ICD-10-CM | POA: Diagnosis present

## 2024-03-02 LAB — CBG MONITORING, ED: Glucose-Capillary: 311 mg/dL — ABNORMAL HIGH (ref 70–99)

## 2024-03-02 MED ORDER — SODIUM CHLORIDE 0.9 % IV BOLUS
500.0000 mL | Freq: Once | INTRAVENOUS | Status: AC
Start: 2024-03-03 — End: 2024-03-03
  Administered 2024-03-03: 500 mL via INTRAVENOUS

## 2024-03-02 NOTE — ED Provider Notes (Signed)
 [place] [facility] Provider Note   CSN: [id number] Arrival date & time: 03/02/24  2308     History  Chief Complaint  Patient presents with   Gait Problem    CAMRAN KEADY is a 78 y.o. male.  Patient is a 78 year old male with past medical history of type 2 diabetes, coronary artery disease with stent, hypertension, diabetic neuropathy.  Patient brought by wife for evaluation of weakness.  She states for many months he has had difficulty with ambulation, however over the past 2 days has been unable to walk at all.  She states that he is very weak and unsteady and fell earlier today.  He denies any injury.  Patient denies other complaints.       Home Medications Prior to Admission medications   Medication Sig Start Date End Date Taking? Authorizing Provider  acetaminophen (TYLENOL) 500 MG tablet Take 500-1,000 mg by mouth every 6 (six) hours as needed (headache/pain.).    [provider]  aspirin EC 81 MG tablet Take 81 mg by mouth in the morning.    [provider]  atorvastatin (LIPITOR) 80 MG tablet Take 40 mg by mouth at bedtime.    [provider]  Carboxymethylcellulose Sod PF 1 % GEL Place 1 drop into both eyes in the morning, at noon, in the evening, and at bedtime.    [provider]  cetirizine (ZYRTEC) 10 MG tablet Take 10 mg by mouth in the morning. cholecacif    [provider]  DULoxetine  (CYMBALTA ) 30 MG capsule Take 90 mg by mouth in the morning.    [provider]  empagliflozin (JARDIANCE) 25 MG TABS tablet Take 25 mg by mouth in the morning.    [provider]  fluticasone (FLONASE) 50 MCG/ACT nasal spray Place 1 spray into both nostrils daily as needed (nasal congestion.). 02/10/13   [provider]  gabapentin (NEURONTIN) 600 MG tablet Take 600-1,200 mg by mouth See admin instructions. Take 2 tablets (1200 mg) by mouth in the morning & take 1 tablet (600  mg) by mouth at night.    [provider]  isosorbide mononitrate (IMDUR) 60 MG 24 hr tablet Take 60 mg by mouth in the morning.    [provider]  levothyroxine (SYNTHROID, LEVOTHROID) 150 MCG tablet Take 150 mcg by mouth daily before breakfast.    [provider]  losartan (COZAAR) 100 MG tablet Take 100 mg by mouth in the morning. 12/06/13   [provider]  metFORMIN (GLUCOPHAGE) 1000 MG tablet Take 1,000 mg by mouth 2 (two) times daily. 02/13/13   [provider]  metoprolol succinate (TOPROL-XL) 100 MG 24 hr tablet Take 100 mg by mouth in the morning.    [provider]  Multiple Vitamin (MULTIVITAMIN) tablet Take 1 tablet by mouth in the morning.    [provider]  nitroGLYCERIN (NITROSTAT) 0.4 MG SL tablet Place 0.4 mg under the tongue every 5 (five) minutes x 3 doses as needed for chest pain.    [provider]  omeprazole (PRILOSEC) 40 MG capsule Take 40 mg by mouth daily before breakfast.    [provider]  pioglitazone (ACTOS) 15 MG tablet Take 15 mg by mouth in the morning.    [provider]  SEMAGLUTIDE, 1 MG/DOSE, [place] Inject 2 mg into the skin every Wednesday.    [provider]  vitamin D3 (CHOLECALCIFEROL) 25 MCG tablet Take 2,000 Units by mouth  at bedtime.    [provider]      Allergies    Codeine, Gabapentin, and Lisinopril    Review of Systems   Review of Systems  All other systems reviewed and are negative.   Physical Exam Updated Vital Signs BP (!) 157/96 (BP Location: Right Arm)   Pulse 92   Temp 98.8 F (37.1 C) (Oral)   Resp (!) 40   Ht 5\' 7"  (1.702 m)   Wt 90.7 kg   SpO2 95%   BMI 31.32 kg/m  Physical Exam Vitals and nursing note reviewed.  Constitutional:      General: He is not in acute distress.    Appearance: He is well-developed. He is not diaphoretic.  HENT:     Head: Normocephalic and atraumatic.  Cardiovascular:     Rate and  Rhythm: Normal rate and regular rhythm.     Heart sounds: No murmur heard.    No friction rub.  Pulmonary:     Effort: Pulmonary effort is normal. No respiratory distress.     Breath sounds: Normal breath sounds. No wheezing or rales.  Abdominal:     General: Bowel sounds are normal. There is no distension.     Palpations: Abdomen is soft.     Tenderness: There is no abdominal tenderness.  Musculoskeletal:        General: Normal range of motion.     Cervical back: Normal range of motion and neck supple.  Skin:    General: Skin is warm and dry.  Neurological:     General: No focal deficit present.     Mental Status: He is alert and oriented to person, place, and time.     Cranial Nerves: No cranial nerve deficit.     Motor: No weakness.     Coordination: Coordination normal.     ED Results / Procedures / Treatments   Labs (all labs ordered are listed, but only abnormal results are displayed) Labs Reviewed  CBG MONITORING, ED - Abnormal; Notable for the following components:      Result Value   Glucose-Capillary 311 (*)    All other components within normal limits  CBC  URINALYSIS, ROUTINE W REFLEX MICROSCOPIC  COMPREHENSIVE METABOLIC PANEL WITH GFR    EKG EKG Interpretation Date/Time:  Saturday March 02 2024 23:40:30 EDT Ventricular Rate:  83 PR Interval:  193 QRS Duration:  113 QT Interval:  382 QTC Calculation: 449 R Axis:   41  Text Interpretation: Sinus rhythm Borderline intraventricular conduction delay Confirmed by Orvilla Blander ([id number]) on 03/02/2024 11:51:36 PM  Radiology No results found.  Procedures Procedures  {Document cardiac monitor, telemetry assessment procedure when appropriate:1}  Medications Ordered in ED Medications  sodium chloride 0.9 % bolus 500 mL (has no administration in time range)    ED Course/ Medical Decision Making/ A&P   {   Click here for ABCD2, HEART and other calculatorsREFRESH Note before signing :1}                               Medical Decision Making Amount and/or Complexity of Data Reviewed Labs: ordered. Radiology: ordered.   ***  {Document critical care time when appropriate:1} {Document review of labs and clinical decision tools ie heart score, Chads2Vasc2 etc:1}  {Document your independent review of radiology images, and any outside records:1} {Document your discussion with family members, caretakers, and with consultants:1} {Document social determinants of health affecting pt's  care:1} {Document your decision making why or why not admission, treatments were needed:1} Final Clinical Impression(s) / ED Diagnoses Final diagnoses:  None    Rx / DC Orders ED Discharge Orders     None

## 2024-03-02 NOTE — ED Triage Notes (Addendum)
 Presents with inability to stand/walk that started today. Per wife "he hasn't been able to walk normal in I can't remember when. He was able to take baby steps up until today". Per patient BIL LE feel weak. Denies strength changes in BIL UE. Per pt T2DM with neuropathy in his legs. When RN touching his legs pt answers with either "don't feel it" or "barely". Wife states she thinks it has to do with his blood sugar. They both state they don't check it at home. Denies incontinence that is new. Denies confusion or any other focal neuro deficit. Fell tonight around 2200. Pt denies hitting his head. Denies pain from fall. Wife reports abrasion to L knee from falling.

## 2024-03-02 NOTE — ED Notes (Addendum)
 Pt noticeably tachypneic. Confirms SHOB when asked. RR 40.  Mouth notably dry.

## 2024-03-03 ENCOUNTER — Emergency Department (HOSPITAL_BASED_OUTPATIENT_CLINIC_OR_DEPARTMENT_OTHER)

## 2024-03-03 DIAGNOSIS — E669 Obesity, unspecified: Secondary | ICD-10-CM

## 2024-03-03 DIAGNOSIS — N390 Urinary tract infection, site not specified: Secondary | ICD-10-CM | POA: Insufficient documentation

## 2024-03-03 DIAGNOSIS — F4312 Post-traumatic stress disorder, chronic: Secondary | ICD-10-CM | POA: Diagnosis not present

## 2024-03-03 DIAGNOSIS — I2510 Atherosclerotic heart disease of native coronary artery without angina pectoris: Secondary | ICD-10-CM

## 2024-03-03 DIAGNOSIS — R262 Difficulty in walking, not elsewhere classified: Secondary | ICD-10-CM | POA: Diagnosis present

## 2024-03-03 DIAGNOSIS — E1140 Type 2 diabetes mellitus with diabetic neuropathy, unspecified: Secondary | ICD-10-CM

## 2024-03-03 DIAGNOSIS — E039 Hypothyroidism, unspecified: Secondary | ICD-10-CM | POA: Diagnosis present

## 2024-03-03 DIAGNOSIS — H25811 Combined forms of age-related cataract, right eye: Secondary | ICD-10-CM | POA: Diagnosis not present

## 2024-03-03 DIAGNOSIS — E782 Mixed hyperlipidemia: Secondary | ICD-10-CM | POA: Diagnosis present

## 2024-03-03 DIAGNOSIS — Z794 Long term (current) use of insulin: Secondary | ICD-10-CM

## 2024-03-03 DIAGNOSIS — N3001 Acute cystitis with hematuria: Secondary | ICD-10-CM

## 2024-03-03 DIAGNOSIS — G4733 Obstructive sleep apnea (adult) (pediatric): Secondary | ICD-10-CM

## 2024-03-03 DIAGNOSIS — I10 Essential (primary) hypertension: Secondary | ICD-10-CM

## 2024-03-03 DIAGNOSIS — Z9182 Personal history of military deployment: Secondary | ICD-10-CM

## 2024-03-03 DIAGNOSIS — E1142 Type 2 diabetes mellitus with diabetic polyneuropathy: Secondary | ICD-10-CM

## 2024-03-03 DIAGNOSIS — M6281 Muscle weakness (generalized): Secondary | ICD-10-CM | POA: Insufficient documentation

## 2024-03-03 DIAGNOSIS — H8113 Benign paroxysmal vertigo, bilateral: Secondary | ICD-10-CM | POA: Insufficient documentation

## 2024-03-03 DIAGNOSIS — K760 Fatty (change of) liver, not elsewhere classified: Secondary | ICD-10-CM | POA: Insufficient documentation

## 2024-03-03 LAB — CBC
HCT: 44.8 % (ref 39.0–52.0)
Hemoglobin: 15.8 g/dL (ref 13.0–17.0)
MCH: 31.9 pg (ref 26.0–34.0)
MCHC: 35.3 g/dL (ref 30.0–36.0)
MCV: 90.3 fL (ref 80.0–100.0)
Platelets: 386 10*3/uL (ref 150–400)
RBC: 4.96 MIL/uL (ref 4.22–5.81)
RDW: 12.9 % (ref 11.5–15.5)
WBC: 9.5 10*3/uL (ref 4.0–10.5)
nRBC: 0 % (ref 0.0–0.2)

## 2024-03-03 LAB — URINALYSIS, MICROSCOPIC (REFLEX): WBC, UA: >50 WBC/hpf (ref 0–5)

## 2024-03-03 LAB — COMPREHENSIVE METABOLIC PANEL WITH GFR
ALT: 12 U/L (ref 0–44)
AST: 19 U/L (ref 15–41)
Albumin: 4.3 g/dL (ref 3.5–5.0)
Alkaline Phosphatase: 106 U/L (ref 38–126)
Anion gap: 13 (ref 5–15)
BUN: 14 mg/dL (ref 8–23)
CO2: 25 mmol/L (ref 22–32)
Calcium: 10.2 mg/dL (ref 8.9–10.3)
Chloride: 95 mmol/L — ABNORMAL LOW (ref 98–111)
Creatinine, Ser: 1.18 mg/dL (ref 0.61–1.24)
GFR, Estimated: >60 mL/min (ref >60)
Glucose, Bld: 304 mg/dL — ABNORMAL HIGH (ref 70–99)
Potassium: 4.2 mmol/L (ref 3.5–5.1)
Sodium: 133 mmol/L — ABNORMAL LOW (ref 135–145)
Total Bilirubin: 0.8 mg/dL (ref 0.0–1.2)
Total Protein: 7.8 g/dL (ref 6.5–8.1)

## 2024-03-03 LAB — RESP PANEL BY RT-PCR (RSV, FLU A&B, COVID)  RVPGX2
Influenza A by PCR: NEGATIVE
Influenza B by PCR: NEGATIVE
Resp Syncytial Virus by PCR: NEGATIVE
SARS Coronavirus 2 by RT PCR: NEGATIVE

## 2024-03-03 LAB — URINALYSIS, ROUTINE W REFLEX MICROSCOPIC
Bilirubin Urine: NEGATIVE
Glucose, UA: >=500 mg/dL — AB
Ketones, ur: NEGATIVE mg/dL
Nitrite: NEGATIVE
Protein, ur: 30 mg/dL — AB
Specific Gravity, Urine: 1.015 (ref 1.005–1.030)
pH: 5.5 (ref 5.0–8.0)

## 2024-03-03 LAB — GLUCOSE, CAPILLARY: Glucose-Capillary: 391 mg/dL — ABNORMAL HIGH (ref 70–99)

## 2024-03-03 LAB — CBG MONITORING, ED: Glucose-Capillary: 379 mg/dL — ABNORMAL HIGH (ref 70–99)

## 2024-03-03 LAB — AMMONIA: Ammonia: 18 umol/L (ref 9–35)

## 2024-03-03 MED ORDER — GABAPENTIN 400 MG PO CAPS: 1200.0000 mg | ORAL_CAPSULE | Freq: Every day | ORAL | Status: DC

## 2024-03-03 MED ORDER — HALOPERIDOL LACTATE 5 MG/ML IJ SOLN
1.0000 mg | Freq: Once | INTRAMUSCULAR | Status: AC
  Administered 2024-03-03: 1 mg via INTRAVENOUS
  Filled 2024-03-03: qty 1

## 2024-03-03 MED ORDER — ACETAMINOPHEN 325 MG PO TABS
ORAL_TABLET | ORAL | Status: AC
  Filled 2024-03-03: qty 1

## 2024-03-03 MED ORDER — ACETAMINOPHEN 325 MG PO TABS: 650.0000 mg | ORAL_TABLET | Freq: Four times a day (QID) | ORAL | Status: DC | PRN

## 2024-03-03 MED ORDER — SODIUM CHLORIDE 0.9 % IV SOLN
1.0000 g | Freq: Once | INTRAVENOUS | Status: AC
  Administered 2024-03-03: 1 g via INTRAVENOUS
  Filled 2024-03-03: qty 10

## 2024-03-03 MED ORDER — ASPIRIN 81 MG PO TBEC
81.0000 mg | DELAYED_RELEASE_TABLET | Freq: Every morning | ORAL | Status: DC
  Administered 2024-03-04 – 2024-03-06 (×3): 81 mg via ORAL
  Filled 2024-03-03 (×3): qty 1

## 2024-03-03 MED ORDER — SODIUM CHLORIDE 0.9% FLUSH
3.0000 mL | Freq: Two times a day (BID) | INTRAVENOUS | Status: DC
  Administered 2024-03-03 – 2024-03-06 (×6): 3 mL via INTRAVENOUS

## 2024-03-03 MED ORDER — GABAPENTIN 300 MG PO CAPS: 300.0000 mg | ORAL_CAPSULE | Freq: Three times a day (TID) | ORAL | Status: DC | PRN

## 2024-03-03 MED ORDER — LORAZEPAM 2 MG/ML IJ SOLN
1.0000 mg | Freq: Once | INTRAMUSCULAR | Status: AC
  Administered 2024-03-03: 1 mg via INTRAVENOUS
  Filled 2024-03-03: qty 1

## 2024-03-03 MED ORDER — ATORVASTATIN CALCIUM 40 MG PO TABS
40.0000 mg | ORAL_TABLET | Freq: Every day | ORAL | Status: DC
  Administered 2024-03-03 – 2024-03-05 (×3): 40 mg via ORAL
  Filled 2024-03-03 (×3): qty 1

## 2024-03-03 MED ORDER — SODIUM CHLORIDE 0.9 % IV SOLN: INTRAVENOUS | Status: AC | PRN

## 2024-03-03 MED ORDER — ACETAMINOPHEN 650 MG RE SUPP: 650.0000 mg | Freq: Four times a day (QID) | RECTAL | Status: DC | PRN

## 2024-03-03 MED ORDER — METOPROLOL SUCCINATE ER 100 MG PO TB24
100.0000 mg | ORAL_TABLET | Freq: Every morning | ORAL | Status: DC
  Administered 2024-03-04 – 2024-03-06 (×2): 100 mg via ORAL
  Filled 2024-03-03 (×3): qty 1

## 2024-03-03 MED ORDER — PANTOPRAZOLE SODIUM 40 MG PO TBEC
40.0000 mg | DELAYED_RELEASE_TABLET | Freq: Every day | ORAL | Status: DC
  Administered 2024-03-04 – 2024-03-06 (×3): 40 mg via ORAL
  Filled 2024-03-03 (×3): qty 1

## 2024-03-03 MED ORDER — SODIUM CHLORIDE 0.9 % IV SOLN
1.0000 g | INTRAVENOUS | Status: DC
  Administered 2024-03-04 – 2024-03-06 (×3): 1 g via INTRAVENOUS
  Filled 2024-03-03 (×3): qty 10

## 2024-03-03 MED ORDER — LOSARTAN POTASSIUM 50 MG PO TABS
100.0000 mg | ORAL_TABLET | Freq: Every morning | ORAL | Status: DC
  Administered 2024-03-04 – 2024-03-06 (×3): 100 mg via ORAL
  Filled 2024-03-03 (×3): qty 2

## 2024-03-03 MED ORDER — LEVOTHYROXINE SODIUM 75 MCG PO TABS
150.0000 ug | ORAL_TABLET | Freq: Every day | ORAL | Status: DC
  Administered 2024-03-04 – 2024-03-06 (×3): 150 ug via ORAL
  Filled 2024-03-03 (×3): qty 2

## 2024-03-03 MED ORDER — POLYETHYLENE GLYCOL 3350 17 G PO PACK: 17.0000 g | PACK | Freq: Every day | ORAL | Status: DC | PRN

## 2024-03-03 MED ORDER — INSULIN ASPART 100 UNIT/ML IJ SOLN: 0.0000 [IU] | Freq: Three times a day (TID) | INTRAMUSCULAR | Status: DC

## 2024-03-03 MED ORDER — ISOSORBIDE MONONITRATE ER 60 MG PO TB24
60.0000 mg | ORAL_TABLET | Freq: Every morning | ORAL | Status: DC
  Administered 2024-03-04 – 2024-03-06 (×3): 60 mg via ORAL
  Filled 2024-03-03 (×3): qty 1

## 2024-03-03 MED ORDER — GABAPENTIN 600 MG PO TABS: 600.0000 mg | ORAL_TABLET | ORAL | Status: DC

## 2024-03-03 MED ORDER — ACETAMINOPHEN 325 MG PO TABS
650.0000 mg | ORAL_TABLET | Freq: Once | ORAL | Status: AC
  Administered 2024-03-03: 650 mg via ORAL
  Filled 2024-03-03: qty 2

## 2024-03-03 MED ORDER — GABAPENTIN 300 MG PO CAPS: 600.0000 mg | ORAL_CAPSULE | Freq: Every day | ORAL | Status: DC

## 2024-03-03 NOTE — ED Notes (Signed)
 Patient continues to remove monitoring equipment and male purewick. MD Delo made aware. See new orders.

## 2024-03-03 NOTE — ED Notes (Signed)
 Assisted patient to bedside commode. Required full 2 person assist. Patient barely able to bear any weight or follow directions on moving legs. Will require bedpan next time. Small BM. Changed bed and brief.

## 2024-03-03 NOTE — Progress Notes (Signed)
 Patient arrives to 5W06 from med center [facility] via [place] at this time

## 2024-03-03 NOTE — ED Notes (Signed)
 Bedpan removed with assistance from Alie N, [place]

## 2024-03-03 NOTE — ED Provider Notes (Signed)
 7:59 AM Patient awaiting his admitting bed.  While reviewing the patient's chart this morning it does appear the patient has leukocytes and bacteria in the setting of this altered mental status and fatigue/weakness.  We will order Rocephin to treat for possible UTI contributing to his mental status and symptoms.  He will continue his plan for admission.   Karmyn Lowman, Marine Sia, MD 03/03/24 0800

## 2024-03-03 NOTE — ED Notes (Signed)
 Called [facility] for transport.

## 2024-03-03 NOTE — H&P (Signed)
 History and Physical   Richard Ruiz:[id number] DOB: 1946/07/20 DOA: 03/02/2024  PCP: Jearldine Mina, MD   Patient coming from: Home  Chief Complaint: Weakness  HPI: Richard Ruiz is a 78 y.o. male with medical history significant of hypertension, hyperlipidemia, diabetes, neuropathy, hypothyroidism, CAD, BPH, fatty liver, obesity, OSA, PTSD, migraines, cataract, muscle weakness presenting for gait abnormality and worsening weakness.  History provided with assistance of patient's family.  Patient has had ongoing issue with weakness and difficulty walking for many months.  However, for the past 2 days patient's weakness has significantly worsened and he has been unable to walk at all.  He had a fall attempting to walk.  Has had ongoing issues with increased urinary frequency specially at night recently as well. Patient and family reports he has been taking medications.  Urinating more frequently due to sugar, appears to be he was started on empagliflozin in the past but per chart review that I can see him at [place] this appears to have been held or discontinued.  They also report a similar episode of confusion and worsening weakness that has occurred previously during this ongoing issue he has had with gait abnormality weakness for which he was treated at a [facility]  He reportedly had a couple days of weakness and difficulty walking and was found to have a UTI and after treatment for this was able to walk after a few days in the hospital  Denies fevers, chills, chest pain, shortness breath, abdominal pain, constipation, diarrhea, nausea, vomiting.    ED Course: Vital signs in ED notable for fever spike of 101, blood pressure in the 110s to 140 systolic, respirate in the 20s.  Lab workup included CMP with sodium 133, chloride 95.  CBC within normal limits.  Urinalysis with glucose, hemoglobin, protein, leukocytes, rare bacteria.  Ammonia normal.  Chest x-ray showed no acute abnormality.  CT  head showed no acute abnormality but did show mildly prominent ventricles.  Patient received Tylenol, Ativan, 500 cc IV fluid initially.  When the patient spiked fever patient received dose of ceftriaxone given urinalysis results.  Concern for metabolic versus neurologic etiology for his worsening weakness.  Review of Systems: As per HPI otherwise all other systems reviewed and are negative.  Past Medical History:  Diagnosis Date   CAD (coronary artery disease)    Carpal tunnel syndrome    bilateral   Cervical spondylosis without myelopathy    Depression    Diabetes mellitus without complication (HCC)    Dyslipidemia    GERD (gastroesophageal reflux disease)    Headache(784.0) 07/15/2013   Hypothyroid    Impotence    Obesity    OSA (obstructive sleep apnea)    on CPAP    Past Surgical History:  Procedure Laterality Date   CATARACT EXTRACTION Left    CORONARY STENT PLACEMENT     INGUINAL HERNIA REPAIR Right    LIPOMA RESECTION Left    neck   PENILE PROSTHESIS IMPLANT      Social History  reports that he quit smoking about 55 years ago. His smoking use included cigarettes. He has never used smokeless tobacco. He reports that he does not drink alcohol and does not use drugs.  Allergies  Allergen Reactions   Codeine Itching   Gabapentin     Jittery   Lisinopril Cough   History reviewed. No pertinent family history.   Prior to Admission medications   Medication Sig Start Date End Date Taking? Authorizing Provider  acetaminophen (TYLENOL) 500 MG tablet Take 500-1,000 mg by mouth every 6 (six) hours as needed (headache/pain.).    [provider]  aspirin EC 81 MG tablet Take 81 mg by mouth in the morning.    [provider]  atorvastatin (LIPITOR) 80 MG tablet Take 40 mg by mouth at bedtime.    [provider]  Carboxymethylcellulose Sod PF 1 % GEL Place 1 drop into both eyes in the morning, at noon, in the evening, and at bedtime.    [provider]  cetirizine (ZYRTEC) 10 MG tablet Take 10 mg by mouth in the morning. cholecacif    [provider]  DULoxetine  (CYMBALTA ) 30 MG capsule Take 90 mg by mouth in the morning.    [provider]  empagliflozin (JARDIANCE) 25 MG TABS tablet Take 25 mg by mouth in the morning.    [provider]  fluticasone (FLONASE) 50 MCG/ACT nasal spray Place 1 spray into both nostrils daily as needed (nasal congestion.). 02/10/13   [provider]  gabapentin (NEURONTIN) 600 MG tablet Take 600-1,200 mg by mouth See admin instructions. Take 2 tablets (1200 mg) by mouth in the morning & take 1 tablet (600 mg) by mouth at night.    [provider]  isosorbide mononitrate (IMDUR) 60 MG 24 hr tablet Take 60 mg by mouth in the morning.    [provider]  levothyroxine (SYNTHROID, LEVOTHROID) 150 MCG tablet Take 150 mcg by mouth daily before breakfast.    [provider]  losartan (COZAAR) 100 MG tablet Take 100 mg by mouth in the morning. 12/06/13   [provider]  metFORMIN (GLUCOPHAGE) 1000 MG tablet Take 1,000 mg by mouth 2 (two) times daily. 02/13/13   [provider]  metoprolol succinate (TOPROL-XL) 100 MG 24 hr tablet Take 100 mg by mouth in the morning.    [provider]  Multiple Vitamin (MULTIVITAMIN) tablet Take 1 tablet by mouth in the morning.    [provider]  nitroGLYCERIN (NITROSTAT) 0.4 MG SL tablet Place 0.4 mg under the tongue every 5 (five) minutes x 3 doses as needed for chest pain.    [provider]  omeprazole (PRILOSEC) 40 MG capsule Take 40 mg by mouth daily before breakfast.    [provider]  pioglitazone (ACTOS) 15 MG tablet Take 15 mg by mouth in the morning.    [provider]  SEMAGLUTIDE, 1 MG/DOSE, [place] Inject 2 mg into the skin every Wednesday.    [provider]  vitamin D3 (CHOLECALCIFEROL) 25 MCG tablet Take 2,000 Units by mouth at  bedtime.    [provider]    Physical Exam: Vitals:   03/03/24 1100 03/03/24 1355 03/03/24 1414 03/03/24 1426  BP: (!) 143/83   134/72  Pulse: 74  70 66  Resp: (!) 22   (!) 24  Temp:  (!) 101 F (38.3 C)    TempSrc:  Oral    SpO2: 97%  99% 100%  Weight:      Height:        Physical Exam Constitutional:      General: He is not in acute distress.    Appearance: Normal appearance.  HENT:     Head: Normocephalic and atraumatic.     Mouth/Throat:     Mouth: Mucous membranes are moist.     Pharynx: Oropharynx is clear.  Eyes:     Extraocular Movements: Extraocular movements intact.     Pupils:  Pupils are equal, round, and reactive to light.  Cardiovascular:     Rate and Rhythm: Normal rate and regular rhythm.     Pulses: Normal pulses.     Heart sounds: Normal heart sounds.  Pulmonary:     Effort: Pulmonary effort is normal. No respiratory distress.     Breath sounds: Normal breath sounds.  Abdominal:     General: Bowel sounds are normal. There is no distension.     Palpations: Abdomen is soft.     Tenderness: There is no abdominal tenderness.  Musculoskeletal:        General: No swelling or deformity.  Skin:    General: Skin is warm and dry.  Neurological:     Comments: Mental Status: Patient is awake, alert, oriented No signs of aphasia or neglect Motor: Good effort thorughout, at Least 5/5 bilateral UE, 4-5/5 bilateral lower extremitiy  Sensory: Sensation is grossly intact bilateral UEs & LEs    Labs on Admission: I have personally reviewed following labs and imaging studies  CBC: Recent Labs  Lab 03/03/24 0005  WBC 9.5  HGB 15.8  HCT 44.8  MCV 90.3  PLT 386    Basic Metabolic Panel: Recent Labs  Lab 03/03/24 0005  NA 133*  K 4.2  CL 95*  CO2 25  GLUCOSE 304*  BUN 14  CREATININE 1.18  CALCIUM 10.2    GFR: Estimated Creatinine Clearance: 57.2 mL/min (by C-G formula based on SCr of 1.18 mg/dL).  Liver Function Tests: Recent Labs   Lab 03/03/24 0005  AST 19  ALT 12  ALKPHOS 106  BILITOT 0.8  PROT 7.8  ALBUMIN 4.3    Urine analysis:    Component Value Date/Time   COLORURINE YELLOW 03/03/2024 0241   APPEARANCEUR CLEAR 03/03/2024 0241   LABSPEC 1.015 03/03/2024 0241   PHURINE 5.5 03/03/2024 0241   GLUCOSEU >=500 (A) 03/03/2024 0241   HGBUR MODERATE (A) 03/03/2024 0241   BILIRUBINUR NEGATIVE 03/03/2024 0241   KETONESUR NEGATIVE 03/03/2024 0241   PROTEINUR 30 (A) 03/03/2024 0241   UROBILINOGEN 1.0 12/19/2008 1130   NITRITE NEGATIVE 03/03/2024 0241   LEUKOCYTESUR TRACE (A) 03/03/2024 0241    Radiological Exams on Admission:  EKG: Independently reviewed.  Sinus rhythm 83 bpm.  Nonspecific T wave changes.  Low voltage in multiple leads.  Assessment/Plan Principal Problem:   Difficulty walking Active Problems:   Obstructive sleep apnea (adult) (pediatric)   DM (diabetes mellitus) (HCC)   Diabetic polyneuropathy (HCC)   Combined forms of age-related cataract, right eye   Chronic post-traumatic stress disorder (PTSD) after military combat   Coronary artery disease   Essential hypertension   Mixed hyperlipidemia   Hypothyroidism   Obesity   UTI (urinary tract infection)   Weakness Difficulty ambulating UTI > Ongoing issues with ambulation and weakness for many months but this abruptly worsened for the past 2 days with patient now unable to walk due to severe weakness.  Did have a fall. > In the ED CT head showed no acute normality other than mildly prominent ventricles.  No significant abnormalities on CMP or CBC. > Urinalysis did show hemoglobin, leukocytes, rare bacteria and patient spiked a fever so he did receive a dose of ceftriaxone. > Patient had similar episode secondary to UTI in the past and he was [facility] with improvement after antibiotic. > Will also rule out viral etiology for his fever given lack of leukocytosis. > Will hold off on neuro imaging or  neurology  involvement given similar episode that improved after treatment of UTI in the past. Can consider this further if he fails to respond or worsens. > No evidence of ascending weakness no difficulty breathing weakness is generalized in the lower extremities. - Monitor on telemetry - Urine culture, will have to be new sample after patient has received antibiotics already. I called lab at med center where urinalysis was done and that sample has already been thrown out. - Respiratory viral panel for flu COVID and RSV - Trend fever curve and WBC - PT/OT eval and treat - Supportive care  Urinary frequency > Acute on chronic.  [place] secondary to UTI issues with this and it is worse possibly due to his empagliflozin use but will need to be ruled out for BPH outpatient.  Hypertension - Plan to resume antihypertensives when confirmed  Hyperlipidemia - Continue home atorvastatin  Diabetes - SSI  Neuropathy - Continue home gabapentin  Hypothyroidism - Continue home Synthroid - Follow-up TSH ordered in the ED  CAD - Continue home ASA, losartan, metoprolol, Imdur  Obesity - Noted  History of cataracts - Noted  DVT prophylaxis: SCDs for now, pending MRI study results Code Status:   Full  Family Communication:  Updated at bedside  Disposition Plan:   Patient is from:  Home  Anticipated DC to:  Home  Anticipated DC date:  1 to 3 days  Anticipated DC barriers: None  Consults called:  None Admission status:  Observation, telemetry  Severity of Illness: The appropriate patient status for this patient is OBSERVATION. Observation status is judged to be reasonable and necessary in order to provide the required intensity of service to ensure the patient's safety. The patient's presenting symptoms, physical exam findings, and initial radiographic and laboratory data in the context of their medical condition is felt to place them at decreased risk for further clinical deterioration. Furthermore,  it is anticipated that the patient will be medically stable for discharge from the hospital within 2 midnights of admission.    Johnetta Nab MD [facility]  How to contact the [facility] Attending or Consulting provider 7A - 7P or covering provider during after hours 7P -7A, for this patient?   Check the care team in [facility] and look for a) attending/consulting [facility] provider listed and b) the [facility] team listed Log into [email or web address] and use [place]'s universal password to access. If you do not have the password, please contact the hospital operator. Locate the [facility] provider you are looking for under [facility] and page to a number that you can be directly reached. If you still have difficulty reaching the provider, please page the [facility] (Director on Call) for the Hospitalists listed on amion for assistance.  03/03/2024, 5:25 PM

## 2024-03-03 NOTE — Progress Notes (Signed)
   03/03/24 2343  BiPAP/CPAP/SIPAP  $ Non-Invasive Home Ventilator  Initial  $ Face Mask Large  Yes  BiPAP/CPAP/SIPAP Pt Type Adult  BiPAP/CPAP/SIPAP DREAMSTATIOND  Mask Type Full face mask  Dentures removed? Not applicable  Mask Size Large  IPAP 8 cmH20  EPAP 5 cmH2O  FiO2 (%) 21 %  Patient Home Machine No  Patient Home Mask No  Patient Home Tubing No  Auto Titrate Yes  Minimum cmH2O 5 cmH2O  Maximum cmH2O 8 cmH2O  BiPAP/CPAP /SiPAP Vitals  Pulse Rate 80  Resp 20  SpO2 96 %  Bilateral Breath Sounds Clear   Patient is unaware of home settings, settings adjusted to patient comfort. No complications noted.

## 2024-03-03 NOTE — ED Notes (Signed)
 Patient offered water, passed swallow screen with slow steady drinking. Denies wanting food at this time. No coughing/choking noted. Family at bedside. Repositioned in bed. Alert and confused, not currently agitated.   Attempted to review home medications, patient and family unsure of what he takes. Requested them to obtain medication list. Provider notified of elevated blood sugar levels.

## 2024-03-03 NOTE — ED Notes (Signed)
 Old purewick male was remove by patient. New purewick male was placed on patient.

## 2024-03-04 ENCOUNTER — Other Ambulatory Visit (HOSPITAL_COMMUNITY): Payer: Self-pay

## 2024-03-04 DIAGNOSIS — R262 Difficulty in walking, not elsewhere classified: Secondary | ICD-10-CM | POA: Diagnosis present

## 2024-03-04 DIAGNOSIS — N390 Urinary tract infection, site not specified: Secondary | ICD-10-CM | POA: Diagnosis present

## 2024-03-04 DIAGNOSIS — E1136 Type 2 diabetes mellitus with diabetic cataract: Secondary | ICD-10-CM | POA: Diagnosis present

## 2024-03-04 DIAGNOSIS — W19XXXA Unspecified fall, initial encounter: Secondary | ICD-10-CM | POA: Diagnosis present

## 2024-03-04 DIAGNOSIS — E876 Hypokalemia: Secondary | ICD-10-CM | POA: Diagnosis present

## 2024-03-04 DIAGNOSIS — G4733 Obstructive sleep apnea (adult) (pediatric): Secondary | ICD-10-CM | POA: Diagnosis present

## 2024-03-04 DIAGNOSIS — Z87891 Personal history of nicotine dependence: Secondary | ICD-10-CM | POA: Diagnosis not present

## 2024-03-04 DIAGNOSIS — R531 Weakness: Secondary | ICD-10-CM | POA: Diagnosis present

## 2024-03-04 DIAGNOSIS — E1142 Type 2 diabetes mellitus with diabetic polyneuropathy: Secondary | ICD-10-CM | POA: Diagnosis present

## 2024-03-04 DIAGNOSIS — E538 Deficiency of other specified B group vitamins: Secondary | ICD-10-CM | POA: Diagnosis present

## 2024-03-04 DIAGNOSIS — N400 Enlarged prostate without lower urinary tract symptoms: Secondary | ICD-10-CM | POA: Diagnosis present

## 2024-03-04 DIAGNOSIS — E1165 Type 2 diabetes mellitus with hyperglycemia: Secondary | ICD-10-CM | POA: Diagnosis present

## 2024-03-04 DIAGNOSIS — E039 Hypothyroidism, unspecified: Secondary | ICD-10-CM | POA: Diagnosis present

## 2024-03-04 DIAGNOSIS — G9341 Metabolic encephalopathy: Secondary | ICD-10-CM | POA: Diagnosis present

## 2024-03-04 DIAGNOSIS — F32A Depression, unspecified: Secondary | ICD-10-CM | POA: Diagnosis present

## 2024-03-04 DIAGNOSIS — Z1152 Encounter for screening for COVID-19: Secondary | ICD-10-CM | POA: Diagnosis not present

## 2024-03-04 DIAGNOSIS — Z7984 Long term (current) use of oral hypoglycemic drugs: Secondary | ICD-10-CM | POA: Diagnosis not present

## 2024-03-04 DIAGNOSIS — K760 Fatty (change of) liver, not elsewhere classified: Secondary | ICD-10-CM | POA: Diagnosis present

## 2024-03-04 DIAGNOSIS — I10 Essential (primary) hypertension: Secondary | ICD-10-CM | POA: Diagnosis present

## 2024-03-04 DIAGNOSIS — I2510 Atherosclerotic heart disease of native coronary artery without angina pectoris: Secondary | ICD-10-CM | POA: Diagnosis present

## 2024-03-04 DIAGNOSIS — E0781 Sick-euthyroid syndrome: Secondary | ICD-10-CM | POA: Diagnosis present

## 2024-03-04 DIAGNOSIS — E669 Obesity, unspecified: Secondary | ICD-10-CM | POA: Diagnosis present

## 2024-03-04 DIAGNOSIS — Z955 Presence of coronary angioplasty implant and graft: Secondary | ICD-10-CM | POA: Diagnosis not present

## 2024-03-04 DIAGNOSIS — E782 Mixed hyperlipidemia: Secondary | ICD-10-CM | POA: Diagnosis present

## 2024-03-04 DIAGNOSIS — Z79890 Hormone replacement therapy (postmenopausal): Secondary | ICD-10-CM | POA: Diagnosis not present

## 2024-03-04 LAB — CBC WITH DIFFERENTIAL/PLATELET
Abs Immature Granulocytes: 0.03 10*3/uL (ref 0.00–0.07)
Basophils Absolute: 0.1 10*3/uL (ref 0.0–0.1)
Basophils Relative: 1 %
Eosinophils Absolute: 0 10*3/uL (ref 0.0–0.5)
Eosinophils Relative: 0 %
HCT: 42.4 % (ref 39.0–52.0)
Hemoglobin: 15 g/dL (ref 13.0–17.0)
Immature Granulocytes: 0 %
Lymphocytes Relative: 23 %
Lymphs Abs: 1.6 10*3/uL (ref 0.7–4.0)
MCH: 32 pg (ref 26.0–34.0)
MCHC: 35.4 g/dL (ref 30.0–36.0)
MCV: 90.4 fL (ref 80.0–100.0)
Monocytes Absolute: 0.7 10*3/uL (ref 0.1–1.0)
Monocytes Relative: 9 %
Neutro Abs: 4.8 10*3/uL (ref 1.7–7.7)
Neutrophils Relative %: 67 %
Platelets: 342 10*3/uL (ref 150–400)
RBC: 4.69 MIL/uL (ref 4.22–5.81)
RDW: 12.9 % (ref 11.5–15.5)
WBC: 7.2 10*3/uL (ref 4.0–10.5)
nRBC: 0 % (ref 0.0–0.2)

## 2024-03-04 LAB — MRSA NEXT GEN BY PCR, NASAL: MRSA by PCR Next Gen: NOT DETECTED

## 2024-03-04 LAB — BASIC METABOLIC PANEL WITH GFR
Anion gap: 9 (ref 5–15)
BUN: 11 mg/dL (ref 8–23)
CO2: 25 mmol/L (ref 22–32)
Calcium: 9.1 mg/dL (ref 8.9–10.3)
Chloride: 97 mmol/L — ABNORMAL LOW (ref 98–111)
Creatinine, Ser: 1.1 mg/dL (ref 0.61–1.24)
GFR, Estimated: >60 mL/min (ref >60)
Glucose, Bld: 340 mg/dL — ABNORMAL HIGH (ref 70–99)
Potassium: 4 mmol/L (ref 3.5–5.1)
Sodium: 131 mmol/L — ABNORMAL LOW (ref 135–145)

## 2024-03-04 LAB — GLUCOSE, CAPILLARY
Glucose-Capillary: 449 mg/dL — ABNORMAL HIGH (ref 70–99)
Glucose-Capillary: 289 mg/dL — ABNORMAL HIGH (ref 70–99)
Glucose-Capillary: 242 mg/dL — ABNORMAL HIGH (ref 70–99)
Glucose-Capillary: 188 mg/dL — ABNORMAL HIGH (ref 70–99)

## 2024-03-04 LAB — BRAIN NATRIURETIC PEPTIDE: B Natriuretic Peptide: 160.3 pg/mL — ABNORMAL HIGH (ref 0.0–100.0)

## 2024-03-04 LAB — T4, FREE: Free T4: 1.07 ng/dL (ref 0.61–1.12)

## 2024-03-04 LAB — C-REACTIVE PROTEIN: CRP: 13.1 mg/dL — ABNORMAL HIGH

## 2024-03-04 LAB — TSH
TSH: 4.96 u[IU]/mL — ABNORMAL HIGH (ref 0.350–4.500)
TSH: 8.6 u[IU]/mL — ABNORMAL HIGH (ref 0.350–4.500)

## 2024-03-04 LAB — PROCALCITONIN: Procalcitonin: 0.14 ng/mL

## 2024-03-04 LAB — MAGNESIUM: Magnesium: 1.6 mg/dL — ABNORMAL LOW (ref 1.7–2.4)

## 2024-03-04 LAB — VITAMIN B12: Vitamin B-12: 240 pg/mL (ref 180–914)

## 2024-03-04 MED ORDER — NITROGLYCERIN 0.4 MG SL SUBL: 0.4000 mg | SUBLINGUAL_TABLET | SUBLINGUAL | Status: DC | PRN

## 2024-03-04 MED ORDER — INSULIN GLARGINE-YFGN 100 UNIT/ML ~~LOC~~ SOLN
18.0000 [IU] | Freq: Every day | SUBCUTANEOUS | Status: DC
  Administered 2024-03-04 – 2024-03-05 (×2): 18 [IU] via SUBCUTANEOUS
  Filled 2024-03-04 (×3): qty 0.18

## 2024-03-04 MED ORDER — INSULIN ASPART 100 UNIT/ML IJ SOLN
0.0000 [IU] | Freq: Three times a day (TID) | INTRAMUSCULAR | Status: DC
  Administered 2024-03-04: 8 [IU] via SUBCUTANEOUS
  Administered 2024-03-04: 5 [IU] via SUBCUTANEOUS
  Administered 2024-03-05: 15 [IU] via SUBCUTANEOUS
  Administered 2024-03-05: 11 [IU] via SUBCUTANEOUS
  Administered 2024-03-05: 3 [IU] via SUBCUTANEOUS
  Administered 2024-03-06: 5 [IU] via SUBCUTANEOUS

## 2024-03-04 MED ORDER — SODIUM CHLORIDE 0.9 % IV SOLN: INTRAVENOUS | Status: AC

## 2024-03-04 MED ORDER — INSULIN ASPART 100 UNIT/ML IJ SOLN
22.0000 [IU] | Freq: Once | INTRAMUSCULAR | Status: AC
  Administered 2024-03-04: 22 [IU] via SUBCUTANEOUS

## 2024-03-04 MED ORDER — INSULIN ASPART 100 UNIT/ML IJ SOLN
0.0000 [IU] | Freq: Every day | INTRAMUSCULAR | Status: DC
  Administered 2024-03-05: 2 [IU] via SUBCUTANEOUS

## 2024-03-04 MED ORDER — ENOXAPARIN SODIUM 40 MG/0.4ML IJ SOSY
40.0000 mg | PREFILLED_SYRINGE | INTRAMUSCULAR | Status: DC
  Administered 2024-03-04 – 2024-03-06 (×3): 40 mg via SUBCUTANEOUS
  Filled 2024-03-04 (×3): qty 0.4

## 2024-03-04 MED ORDER — PIOGLITAZONE HCL 15 MG PO TABS
15.0000 mg | ORAL_TABLET | Freq: Every morning | ORAL | Status: DC
  Administered 2024-03-04 – 2024-03-06 (×3): 15 mg via ORAL
  Filled 2024-03-04 (×4): qty 1

## 2024-03-04 MED ORDER — MAGNESIUM SULFATE 4 GM/100ML IV SOLN
4.0000 g | Freq: Once | INTRAVENOUS | Status: AC
  Administered 2024-03-04: 4 g via INTRAVENOUS
  Filled 2024-03-04: qty 100

## 2024-03-04 NOTE — Progress Notes (Addendum)
 PROGRESS NOTE                                                                                                                                                                                                             Patient Demographics:    Richard Ruiz, is a 78 y.o. male, DOB - 1946/08/30, ZOX:[id number]  Outpatient Primary MD for the patient is Husain, Karrar, MD    LOS - 0  Admit date - 03/02/2024    Chief Complaint  Patient presents with   Gait Problem       Brief Narrative (HPI from H&P)   78 y.o. male with medical history significant of hypertension, hyperlipidemia, diabetes, chronic peripheral neuropathy, hypothyroidism, CAD, BPH, fatty liver, obesity, OSA, PTSD, migraines, cataract, muscle weakness presenting for gait abnormality and worsening weakness.  Apparently patient has had similar episode related to UTI few months ago, he was brought in by family members for 2 to 3-day history of acute on chronic generalized weakness mild confusion, weight consistent with UTI.  He was admitted for further care.   Subjective:    Richard Ruiz today has, No headache, No chest pain, No abdominal pain - No Nausea, No new weakness tingling or numbness, no SOB, does have continued generalized weakness.   Assessment  & Plan :    UTI causing acute on chronic generalized weakness in a patient with advanced chronic peripheral neuropathy with mild metabolic encephalopathy at the time of admission. He has had similar episode few months ago related to UTI, continue Rocephin, continue IV fluids, follow urine cultures, PT OT, head CT stable, no headache or focal deficits, continue to monitor closely.  Mentation back to normal.  Require outpatient urology follow-up.   Hypertension.  Home medications resumed.  Dyslipidemia.  On statin.  Hypothyroidism.  On home dose Synthroid will check TSH and free T4.  Diabetic peripheral neuropathy.   Has diffuse generalized weakness, continue Neurontin, check TSH, B12, outpatient neurology follow-up.  PT-OT here.  CAD.  Continue home aspirin, beta-blocker, Imdur and ARB combination.  No acute issues.  OSA.  Nighttime CPAP.  Hypomagnesemia.  Replaced.    DM type II.  In poor control.  A1c recheck, on Glucotrol which will be resumed, ISS added, diabetic insulin education, may need to go home on additional insulin.    Lab Results  Component Value Date   HGBA1C 9.3 (H) 09/16/2022   CBG (last 3)  Recent Labs    03/03/24 0746 03/03/24 1946 03/04/24 0748  GLUCAP 379* 391* 449*          Condition - Fair  Family Communication  : Son bedside on 03/04/2024  Code Status :  Full  Consults  :  None  PUD Prophylaxis : PPI   Procedures  :     CT head.  Nonacute      Disposition Plan  :    Status is: Observation  DVT Prophylaxis  :  Lovenox  SCDs Start: 03/03/24 1724    Lab Results  Component Value Date   PLT 342 03/04/2024    Diet :  Diet Order             Diet Carb Modified Fluid consistency: Thin; Room service appropriate? Yes  Diet effective now                    Inpatient Medications  Scheduled Meds:  aspirin EC  81 mg Oral q AM   atorvastatin  40 mg Oral QHS   insulin aspart  0-15 Units Subcutaneous TID WC   insulin aspart  0-5 Units Subcutaneous QHS   isosorbide mononitrate  60 mg Oral q AM   levothyroxine  150 mcg Oral Q0600   losartan  100 mg Oral q AM   metoprolol succinate  100 mg Oral q AM   pantoprazole  40 mg Oral Daily   pioglitazone  15 mg Oral q AM   sodium chloride flush  3 mL Intravenous Q12H   Continuous Infusions:  sodium chloride     cefTRIAXone (ROCEPHIN)  IV     magnesium sulfate bolus IVPB     PRN Meds:.acetaminophen **OR** acetaminophen, gabapentin **AND** [DISCONTINUED] gabapentin, nitroGLYCERIN, polyethylene glycol  Antibiotics  :    Anti-infectives (From admission, onward)    Start     Dose/Rate Route  Frequency Ordered Stop   03/04/24 0800  cefTRIAXone (ROCEPHIN) 1 g in sodium chloride 0.9 % 100 mL IVPB        1 g 200 mL/hr over 30 Minutes Intravenous Every 24 hours 03/03/24 1905     03/03/24 0800  cefTRIAXone (ROCEPHIN) 1 g in sodium chloride 0.9 % 100 mL IVPB        1 g 200 mL/hr over 30 Minutes Intravenous  Once 03/03/24 0759 03/03/24 0841         Objective:   Vitals:   03/03/24 2343 03/04/24 0015 03/04/24 0400 03/04/24 0720  BP:  136/87 126/69 (!) 147/94  Pulse: 80 74 62 75  Resp: 20 17 19 17   Temp:  98.5 F (36.9 C) 98 F (36.7 C) (!) 97.3 F (36.3 C)  TempSrc:  Oral Oral Oral  SpO2: 96% 96% 100% 100%  Weight:      Height:        Wt Readings from Last 3 Encounters:  03/02/24 90.7 kg  12/13/23 86.2 kg  09/16/22 91.9 kg     Intake/Output Summary (Last 24 hours) at 03/04/2024 0807 Last data filed at 03/03/2024 1452 Gross per 24 hour  Intake 110.19 ml  Output --  Net 110.19 ml     Physical Exam  Awake Alert, No new F.N deficits, Normal affect [place].AT,PERRAL Supple Neck, No JVD,   Symmetrical Chest wall movement, Good air movement bilaterally, CTAB RRR,No Gallops,Rubs or new Murmurs,  +ve B.Sounds, Abd Soft, No tenderness,  No Cyanosis, Clubbing or edema     Data Review:    Recent Labs  Lab 03/03/24 0005 03/04/24 0554  WBC 9.5 7.2  HGB 15.8 15.0  HCT 44.8 42.4  PLT 386 342  MCV 90.3 90.4  MCH 31.9 32.0  MCHC 35.3 35.4  RDW 12.9 12.9  LYMPHSABS  --  1.6  MONOABS  --  0.7  EOSABS  --  0.0  BASOSABS  --  0.1    Recent Labs  Lab 03/03/24 0005 03/03/24 0430 03/04/24 0554  NA 133*  --  131*  K 4.2  --  4.0  CL 95*  --  97*  CO2 25  --  25  ANIONGAP 13  --  9  GLUCOSE 304*  --  340*  BUN 14  --  11  CREATININE 1.18  --  1.10  AST 19  --   --   ALT 12  --   --   ALKPHOS 106  --   --   BILITOT 0.8  --   --   ALBUMIN 4.3  --   --   CRP  --   --  13.1*  AMMONIA  --  18  --   BNP  --   --  160.3*  MG  --   --  1.6*  CALCIUM 10.2  --   9.1      Recent Labs  Lab 03/03/24 0005 03/03/24 0430 03/04/24 0554  CRP  --   --  13.1*  AMMONIA  --  18  --   BNP  --   --  160.3*  MG  --   --  1.6*  CALCIUM 10.2  --  9.1    --------------------------------------------------------------------------------------------------------------- No results found for: "CHOL", "HDL", "LDLCALC", "LDLDIRECT", "TRIG", "CHOLHDL"  Lab Results  Component Value Date   HGBA1C 9.3 (H) 09/16/2022    Micro Results Recent Results (from the past 240 hours)  Resp panel by RT-PCR (RSV, Flu A&B, Covid) Anterior Nasal Swab     Status: None   Collection Time: 03/03/24  5:53 PM   Specimen: Anterior Nasal Swab  Result Value Ref Range Status   SARS Coronavirus 2 by RT PCR NEGATIVE NEGATIVE Final   Influenza A by PCR NEGATIVE NEGATIVE Final   Influenza B by PCR NEGATIVE NEGATIVE Final    Comment: (NOTE) The Xpert Xpress SARS-CoV-2/FLU/RSV plus assay is intended as an aid in the diagnosis of influenza from Nasopharyngeal swab specimens and should not be used as a sole basis for treatment. Nasal washings and aspirates are unacceptable for Xpert Xpress SARS-CoV-2/FLU/RSV testing.  Fact Sheet for Patients: BloggerCourse.com  Fact Sheet for Healthcare Providers: SeriousBroker.it  This test is not yet approved or cleared by the [place]  FDA and has been authorized for detection and/or diagnosis of SARS-CoV-2 by FDA under an Emergency Use Authorization (EUA). This EUA will remain in effect (meaning this test can be used) for the duration of the COVID-19 declaration under Section 564(b)(1) of the Act, 21 U.S.C. section 360bbb-3(b)(1), unless the authorization is terminated or revoked.     Resp Syncytial Virus by PCR NEGATIVE NEGATIVE Final    Comment: (NOTE) Fact Sheet for Patients: BloggerCourse.com  Fact Sheet for Healthcare  Providers: SeriousBroker.it  This test is not yet approved or cleared by the [place]  FDA and has been authorized for detection and/or diagnosis of SARS-CoV-2 by FDA under an Emergency Use Authorization (EUA). This EUA will remain in effect (meaning this test can  be used) for the duration of the COVID-19 declaration under Section 564(b)(1) of the Act, 21 U.S.C. section 360bbb-3(b)(1), unless the authorization is terminated or revoked.  Performed at [facility], [address], [place], [place] [address]   MRSA Next Gen by PCR, Nasal     Status: None   Collection Time: 03/04/24  5:33 AM   Specimen: Nasal Mucosa; Nasal Swab  Result Value Ref Range Status   MRSA by PCR Next Gen NOT DETECTED NOT DETECTED Final    Comment: (NOTE) The GeneXpert MRSA Assay (FDA approved for NASAL specimens only), is one component of a comprehensive MRSA colonization surveillance program. It is not intended to diagnose MRSA infection nor to guide or monitor treatment for MRSA infections. Test performance is not FDA approved in patients less than 52 years old. Performed at [facility], [address] [place], [place] [address]     Radiology Report DG Chest Port 1 View Result Date: 03/03/2024 CLINICAL DATA:  Fall several hours ago, initial encounter EXAM: PORTABLE CHEST 1 VIEW COMPARISON:  09/16/2013 FINDINGS: Cardiac shadow is within normal limits. Lungs are clear bilaterally. No bony abnormality is seen. Postsurgical changes in the lower neck are noted. IMPRESSION: No active disease. Electronically Signed   By: Violeta Grey M.D.   On: 03/03/2024 00:46   CT Head Wo Contrast Result Date: 03/03/2024 CLINICAL DATA:  Inability to speak or walk. EXAM: CT HEAD WITHOUT CONTRAST TECHNIQUE: Contiguous axial images were obtained from the base of the skull through the vertex without intravenous contrast. RADIATION DOSE REDUCTION: This exam was performed according to the  departmental dose-optimization program which includes automated exposure control, adjustment of the mA and/or kV according to patient size and/or use of iterative reconstruction technique. COMPARISON:  None Available. FINDINGS: Brain: No evidence of acute infarction, hemorrhage, extra-axial collection or mass lesion/mass effect. Mild prominence of the lateral ventricles is noted. Vascular: No hyperdense vessel or unexpected calcification. Skull: Normal. Negative for fracture or focal lesion. Sinuses/Orbits: No acute finding. Other: None. IMPRESSION: Mildly prominent lateral ventricles. No other focal abnormality is noted. Electronically Signed   By: Violeta Grey M.D.   On: 03/03/2024 00:39     Signature  -   Lynnwood Sauer M.D on 03/04/2024 at 8:07 AM   -  To page go to [email or web address]

## 2024-03-04 NOTE — TOC Initial Note (Addendum)
 Transition of Care [facility]) - Initial/Assessment Note    Patient Details  Name: Richard Ruiz MRN: [id number] Date of Birth: 03/22/1946  [facility]) CM/SW Contact:    Jonathan Neighbor, RN Phone Number: 03/04/2024, 2:22 PM  Clinical Narrative:                  Pt is from home with his spouse. Spouse is able to provide supervision and assistance at home. Spouse has been doing most of the driving.  Pt manages his own medications and denies any issues.  Home health arranged with [facility]. CM will send information to [facility] for approval. Information for pt on AVS. Gasper Karst will contact him for the first home visit. [facility] following.  Expected Discharge Plan: Home w Home Health Services Barriers to Discharge: Continued Medical Work up   Patient Goals and CMS Choice   CMS Medicare.gov Compare Post Acute Care list provided to:: Patient Choice offered to / list presented to : Patient, Spouse      Expected Discharge Plan and Services   Discharge Planning Services: CM Consult Post Acute Care Choice: Home Health Living arrangements for the past 2 months: Single Family Home                           HH Arranged: PT, OT HH Agency: [facility] Date [facility] Agency Contacted: 03/04/24   Representative spoke with at [facility] Agency: Randel Buss  Prior Living Arrangements/Services Living arrangements for the past 2 months: Single Family Home Lives with:: Spouse Patient language and need for interpreter reviewed:: Yes Do you feel safe going back to the place where you live?: Yes        Care giver support system in place?: Yes (comment) Current home services: DME (cane/ walker/ BSC/ rollator/ CPAP) Criminal Activity/Legal Involvement Pertinent to Current Situation/Hospitalization: No - Comment as needed  Activities of Daily Living      Permission Sought/Granted                  Emotional Assessment Appearance:: Appears stated  age Attitude/Demeanor/Rapport: Engaged Affect (typically observed): Accepting Orientation: : Oriented to Self, Oriented to Place, Oriented to  Time, Oriented to Situation   Psych Involvement: No (comment)  Admission diagnosis:  Weakness [R53.1] Difficulty walking [R26.2] Patient Active Problem List   Diagnosis Date Noted   Difficulty walking 03/03/2024   Combined forms of age-related cataract, right eye 03/03/2024   Mixed hyperlipidemia 03/03/2024   Muscle weakness (generalized) 03/03/2024   Hypothyroidism 03/03/2024   Fatty (change of) liver, not elsewhere classified 03/03/2024   Benign paroxysmal vertigo, bilateral 03/03/2024   UTI (urinary tract infection) 03/03/2024   Chronic post-traumatic stress disorder (PTSD) after military combat 08/15/2022   Coronary artery disease 08/15/2022   Essential hypertension 08/15/2022   Migraine 08/15/2022   Obesity 08/15/2022   Headache 07/15/2013   Abnormality of gait 02/28/2013   Obstructive sleep apnea (adult) (pediatric) 02/28/2013   DM (diabetes mellitus) (HCC) 02/28/2013   Diabetic polyneuropathy (HCC) 02/28/2013   PCP:  [facility]:   [facility] [address] ([address]), [place] - [address] [place] ([place]) [place] [address] Phone: [phone] Fax: [phone]  [facility] [address] [place] [address] Phone: [phone] Fax: [phone]     Social Drivers of Health (SDOH) Social History: SDOH Screenings   Food Insecurity:  No Food Insecurity (03/04/2024)  Housing: Unknown (03/04/2024)  Transportation Needs: No Transportation Needs (03/04/2024)  Utilities: Not At Risk (03/04/2024)  Social Connections: Unknown (03/04/2024)  Tobacco Use: Medium Risk (03/02/2024)   SDOH Interventions:     Readmission Risk Interventions     No data to display

## 2024-03-04 NOTE — Inpatient Diabetes Management (Addendum)
 Inpatient Diabetes Program Recommendations  AACE/ADA: New Consensus Statement on Inpatient Glycemic Control   Target Ranges:  Prepandial:   less than 140 mg/dL      Peak postprandial:   less than 180 mg/dL (1-2 hours)      Critically ill patients:  140 - 180 mg/dL    Latest Reference Range & Units 03/02/24 23:19 03/03/24 07:46 03/03/24 19:46 03/04/24 07:48  Glucose-Capillary 70 - 99 mg/dL 621 (H) 308 (H) 657 (H) 449 (H)    Latest Reference Range & Units 03/03/24 00:05 03/04/24 05:54  Glucose 70 - 99 mg/dL 846 (H) 962 (H)    Latest Reference Range & Units 09/16/22 11:30  Hemoglobin A1C 4.8 - 5.6 % 9.3 (H)    Review of Glycemic Control  Diabetes history: DM2 Outpatient Diabetes medications: Ozempic 2 mg Qweek (Wednesday), Actos 15 mg QAM, Metformin 1000 mg BID, Jardiance 25 mg QAM Current orders for Inpatient glycemic control: Novolog 0-15 units TID with meals, Novolog 0-5 units at bedtime, Actos 15 mg QAM, Novolog 22 units X1 now  Inpatient Diabetes Program Recommendations:    Insulin: May want to consider ordering Semglee 10 units daily.  HbgA1C: Current A1C in process.  Outpatient DM: At time of discharge, please provide Rx for FreeStyle Libre3 Plus sensors [phone]) and FreeStyle Libre3 reader [id number]).  NOTE: Patient admitted with weakness, difficulty walking, UTI.  Initial glucose 311 mg/dl on 0/2/72. Will plan to see patient today.  Addendum 03/04/24@11 :45-Spoke with patient at bedside. Patient reports being followed by PCP at [facility] for diabetes management and currently taking Lantus 22 units at bedtime, Metformin 1000 mg BID, Actos 15 mg QAM, and Ozempic 2 mg Qweek (Wednesday) as an outpatient for diabetes control. Inquired about Jardiance and patient states that his no longer taking Jardiance.  Patient reports he has been on Lantus since January 2025. Patient reports taking DM medications as prescribed and reports CBGs are consistently in 300's mg/dl.  Patient reports he has been  having symptoms of hyperglycemia for several months.  Inquired about prior A1C and patient reports not being able to recall last A1C value. Informed patient that his current A1C is still in process.  Discussed glucose and A1C goals. Discussed importance of checking CBGs and maintaining good CBG control to prevent long-term and short-term complications. Explained how hyperglycemia leads to damage within blood vessels which lead to the common complications seen with uncontrolled diabetes. Stressed to the patient the importance of improving glycemic control to prevent further complications from uncontrolled diabetes. Discussed impact of nutrition, exercise, stress, sickness, and medications on diabetes control.  Inquired about any prior use of continuous glucose monitoring (CGM) sensors and patient states he has never used a CGM. Patient reports that he does not have a smart phone. Discussed how a CGM works and explained that he would need to get a reader to use with it since he does not have a smart phone. Patient would like to use CGM. Informed patient that I would ask attending provider if our team could give him a sample of the CGM sensor and if so we will follow up with him tomorrow to educate on how to use it.  Patient reports he has plenty of DM medications at home.  Encouraged patient to ask PCP about seeing an Endocrinologist through the [place] or locally so they could assist with getting DM under better control.  Patient verbalized understanding of information discussed and reports no further questions at this time related to diabetes. Asked outpatient TOC  pharmacy to check and see if Dexcom G7 or FreeStyle Libre 3 CGM is covered. They were not able to tell which CGM sensors are covered and note that patient would need to get them through the [place]. Dr. Zelda Hickman provided order to give patient CGM sensor sample. Inpatient diabetes team will follow up with patient tomorrow to educate on CGM use.  Thanks, Beacher Limerick,  RN, MSN, CDCES Diabetes Coordinator Inpatient Diabetes Program [phone] (Team Pager from 8am to 5pm)

## 2024-03-04 NOTE — Evaluation (Signed)
 Physical Therapy Evaluation Patient Details Name: Richard Ruiz MRN: [id number] DOB: 1946-03-21 Today's Date: 03/04/2024  History of Present Illness  Pt is a 78 y/o male presenting with progressive weakness and difficulty walking. Found to have UTI. PMH: HTN, HLD, DM, neuropathy, hypothyroidism, CAD, BPH, fatty liver, OSA, PTSD, migraines, cataract.  Clinical Impression  Richard Ruiz is 78 y.o. male admitted with above HPI and diagnosis. Patient is currently limited by functional impairments below (see PT problem list). Patient lives with spouse and is mod ind with RW for mobility in home, however has history of frequent falls. Pt currently requires CGA for sit<>stand with cues needed for safe hand placement to power up from chair. During gait pt noted to shuffle feet with little to no hip flexion for step advancement or clearance. Cues for "heel to toe" pattern improved steps mildly. Belt strapped to RW for visual and tactile cues to for pt to march knees to tap belt for each step. Step height and length greatly improved with cue and pt reported reduce fatigue. Patient will benefit from continued skilled PT interventions to address impairments and progress independence with mobility, recommending HHPT with 24/7 supervision from family. Acute PT will follow and progress as able.         If plan is discharge home, recommend the following: A little help with walking and/or transfers;A little help with bathing/dressing/bathroom;Assistance with cooking/housework;Assist for transportation;Help with stairs or ramp for entrance   Can travel by private vehicle        Equipment Recommendations None recommended by PT  Recommendations for Other Services       Functional Status Assessment Patient has had a recent decline in their functional status and demonstrates the ability to make significant improvements in function in a reasonable and predictable amount of time.     Precautions / Restrictions  Precautions Precautions: Fall Recall of Precautions/Restrictions: Intact Precaution/Restrictions Comments: tendency to fall forward Restrictions Weight Bearing Restrictions Per Provider Order: No      Mobility  Bed Mobility               General bed mobility comments: pt OOB in recliner    Transfers Overall transfer level: Needs assistance Equipment used: Rolling walker (2 wheels), None Transfers: Sit to/from Stand Sit to Stand: Contact guard assist           General transfer comment: cues for hand placement to power up    Ambulation/Gait Ambulation/Gait assistance: Min assist Gait Distance (Feet): 80 Feet (2x40; 1x80) Assistive device: Rolling walker (2 wheels) Gait Pattern/deviations: Step-through pattern, Decreased step length - right, Decreased step length - left, Decreased stride length, Decreased dorsiflexion - left, Decreased dorsiflexion - right, Shuffle, Trunk flexed Gait velocity: decr     General Gait Details: poor step length and foot clearance. improved withbelt for visual/tactile cue to march LE's for step progression.  Stairs            Wheelchair Mobility     Tilt Bed    Modified Rankin (Stroke Patients Only)       Balance Overall balance assessment: Needs assistance Sitting-balance support: No upper extremity supported, Feet supported Sitting balance-Leahy Scale: Good     Standing balance support: Bilateral upper extremity supported, During functional activity Standing balance-Leahy Scale: Poor                               Pertinent Vitals/Pain Pain Assessment Pain Assessment:  No/denies pain    Home Living Family/patient expects to be discharged to:: Private residence Living Arrangements: Spouse/significant other Available Help at Discharge: Family;Available 24 hours/day Type of Home: House Home Access: Stairs to enter Entrance Stairs-Rails: Right;Left;Can reach both Entrance Stairs-Number of Steps: 2    Home Layout: One level Home Equipment: Rollator (4 wheels);Cane - single point;BSC/3in1;Other (comment);Rolling Walker (2 wheels) (may have BSC) Additional Comments: hx of falls with 4WW, 2 in last week PTA, countless in last month\    Prior Function Prior Level of Function : Independent/Modified Independent;Driving;History of Falls (last six months)             Mobility Comments: uses cane mostly for mobility, hx of falls due to difficulty lifting feet. uses rollator sometimes out in community (leaving [employer], etc) ADLs Comments: Indep with ADLs, shares cleaning with wife Occupational psychologist, etc), drives self to [facility] [place]     Extremity/Trunk Assessment   Upper Extremity Assessment Upper Extremity Assessment: Defer to OT evaluation;Overall [facility] for tasks assessed    Lower Extremity Assessment Lower Extremity Assessment: Generalized weakness;RLE deficits/detail;LLE deficits/detail    Cervical / Trunk Assessment Cervical / Trunk Assessment: Normal  Communication   Communication Communication: No apparent difficulties    Cognition Arousal: Alert Behavior During Therapy: WFL for tasks assessed/performed   PT - Cognitive impairments: No apparent impairments                         Following commands: Impaired Following commands impaired: Only follows one step commands consistently, Follows one step commands with increased time, Follows multi-step commands with increased time     Cueing Cueing Techniques: Verbal cues, Gestural cues     General Comments General comments (skin integrity, edema, etc.): Son present and supportive. Provided gait belt    Exercises     Assessment/Plan    PT Assessment Patient needs continued PT services  PT Problem List Decreased strength;Decreased knowledge of precautions;Decreased safety awareness;Decreased knowledge of use of DME;Decreased mobility;Decreased balance;Decreased activity tolerance;Decreased range of  motion;Cardiopulmonary status limiting activity       PT Treatment Interventions DME instruction;Gait training;Stair training;Functional mobility training;Therapeutic activities;Therapeutic exercise;Balance training;Neuromuscular re-education;Patient/family education    PT Goals (Current goals can be found in the Care Plan section)  Acute Rehab PT Goals Patient Stated Goal: stop falling, improve balance PT Goal Formulation: With patient Time For Goal Achievement: 03/18/24 Potential to Achieve Goals: Good    Frequency Min 2X/week     Co-evaluation               AM-PAC PT "6 Clicks" Mobility  Outcome Measure Help needed turning from your back to your side while in a flat bed without using bedrails?: A Little Help needed moving from lying on your back to sitting on the side of a flat bed without using bedrails?: A Little Help needed moving to and from a bed to a chair (including a wheelchair)?: A Little Help needed standing up from a chair using your arms (e.g., wheelchair or bedside chair)?: A Little Help needed to walk in hospital room?: A Little Help needed climbing 3-5 steps with a railing? : A Little 6 Click Score: 18    End of Session Equipment Utilized During Treatment: Gait belt Activity Tolerance: Patient tolerated treatment well Patient left: in chair;with call bell/phone within reach;with chair alarm set Nurse Communication: Mobility status PT Visit Diagnosis: Muscle weakness (generalized) (M62.81);Difficulty in walking, not elsewhere classified (R26.2);Unsteadiness on feet (R26.81);Other abnormalities  of gait and mobility (R26.89)    Time: 1610-9604 PT Time Calculation (min) (ACUTE ONLY): 33 min   Charges:   PT Evaluation $PT Eval Moderate Complexity: 1 Mod PT Treatments $Gait Training: 8-22 mins PT General Charges $$ ACUTE PT VISIT: 1 Visit         Tish Forge, DPT Acute Rehabilitation Services Office [phone]  03/04/24 1:03 PM

## 2024-03-04 NOTE — Progress Notes (Signed)
 Pt's cbg=449. MD notified. New orders received.   Oral Billings, RN

## 2024-03-04 NOTE — Evaluation (Signed)
 Occupational Therapy Evaluation Patient Details Name: Richard Ruiz MRN: [id number] DOB: 1946/06/09 Today's Date: 03/04/2024   History of Present Illness   Pt is a 78 y/o male presenting with progressive weakness and difficulty walking. Found to have UTI. PMH: HTN, HLD, DM, neuropathy, hypothyroidism, CAD, BPH, fatty liver, OSA, PTSD, migraines, cataract.     Clinical Impressions PTA, pt lives with spouse, typically ambulatory with cane vs Rollator and Modified Independent with ADLs. Pt reports driving and assisting with IADLs at home as well. Pt reports a hx of falls. Pt presents now with deficits in standing balance, endurance and cognition (though improving per son who was present). Overall, pt required Min A for transfers without AD but improved to CGA with use of RW though will benefit from further reinforcement of DME use. Pt requires overall Min A for ADL completion. Provided gait belt for use at home, encouraged obtaining shower chair and recommend pt to have RW prior to DC home w/ HHOT services.      If plan is discharge home, recommend the following:   A little help with walking and/or transfers;A little help with bathing/dressing/bathroom;Assistance with cooking/housework     Functional Status Assessment   Patient has had a recent decline in their functional status and demonstrates the ability to make significant improvements in function in a reasonable and predictable amount of time.     Equipment Recommendations   Other (comment) (RW)     Recommendations for Other Services         Precautions/Restrictions   Precautions Precautions: Fall Restrictions Weight Bearing Restrictions Per Provider Order: No     Mobility Bed Mobility Overal bed mobility: Needs Assistance Bed Mobility: Supine to Sit     Supine to sit: Min assist, HOB elevated, Used rails     General bed mobility comments: Min A via handheld assist to lift trunk    Transfers Overall  transfer level: Needs assistance Equipment used: Rolling walker (2 wheels), None Transfers: Sit to/from Stand, Bed to chair/wheelchair/BSC Sit to Stand: Min assist, Contact guard assist     Step pivot transfers: Min assist     General transfer comment: initially Min a for [facility] transfer w/o AD improving to CGA with use of RW. Consistent cues needed for hand placement though poor carryover at this time      Balance Overall balance assessment: Needs assistance Sitting-balance support: No upper extremity supported, Feet supported Sitting balance-Leahy Scale: Good     Standing balance support: Bilateral upper extremity supported, During functional activity Standing balance-Leahy Scale: Poor                             ADL either performed or assessed with clinical judgement   ADL Overall ADL's : Needs assistance/impaired Eating/Feeding: Independent   Grooming: Contact guard assist;Standing;Oral care;Wash/dry face Grooming Details (indicate cue type and reason): oral care, denture care standing at sink w/o LOB. standing > 6 min for task Upper Body Bathing: Set up;Sitting   Lower Body Bathing: Minimal assistance;Sitting/lateral leans;Sit to/from stand   Upper Body Dressing : Set up;Sitting   Lower Body Dressing: Minimal assistance;Sitting/lateral leans;Sit to/from stand   Toilet Transfer: Contact guard assist;Ambulation;Rolling walker (2 wheels);Minimal assistance;Stand-pivot;BSC/3in1 Statistician Details (indicate cue type and reason): initially Min A to transfer to [facility] with handheld assist. progressing to CGA with use of RW Toileting- Clothing Manipulation and Hygiene: Minimal assistance;Sit to/from stand;Sitting/lateral lean Toileting - Clothing Manipulation Details (indicate cue  type and reason): assist for posterior hygiene in standing       General ADL Comments: Son present. Discussed improved balance with use of RW, appropriate use of DME, fall prevention,  locating shower chair at home and initial supervision for showering to ensure safety     Vision Baseline Vision/History: 1 Wears glasses Ability to See in Adequate Light: 0 Adequate Patient Visual Report: No change from baseline Vision Assessment?: No apparent visual deficits     Perception         Praxis         Pertinent Vitals/Pain Pain Assessment Pain Assessment: No/denies pain     Extremity/Trunk Assessment Upper Extremity Assessment Upper Extremity Assessment: Overall WFL for tasks assessed;Right hand dominant   Lower Extremity Assessment Lower Extremity Assessment: Defer to PT evaluation   Cervical / Trunk Assessment Cervical / Trunk Assessment: Normal   Communication Communication Communication: No apparent difficulties   Cognition Arousal: Alert Behavior During Therapy: WFL for tasks assessed/performed Cognition: Cognition impaired     Awareness: Intellectual awareness intact, Online awareness impaired   Attention impairment (select first level of impairment): Selective attention Executive functioning impairment (select all impairments): Sequencing, Problem solving OT - Cognition Comments: per son, much improved cognition compared to yesterday. Pt able to show insight into deficits though minor cues needed to correct DME use, problem solve, etc. Witty humor, appropriate sequencing of ADLs.                 Following commands: Impaired Following commands impaired: Only follows one step commands consistently, Follows one step commands with increased time, Follows multi-step commands with increased time     Cueing  General Comments   Cueing Techniques: Verbal cues;Gestural cues  Son present and supportive. Provided gait belt   Exercises     Shoulder Instructions      Home Living Family/patient expects to be discharged to:: Private residence Living Arrangements: Spouse/significant other Available Help at Discharge: Family;Available 24  hours/day Type of Home: House Home Access: Stairs to enter [employer] of Steps: 2 Entrance Stairs-Rails: Right;Left Home Layout: One level     Bathroom Shower/Tub: Producer, television/film/video: Standard     Home Equipment: Rollator (4 wheels);Cane - single point;BSC/3in1;Other (comment) (may have BSC)          Prior Functioning/Environment Prior Level of Function : Independent/Modified Independent;Driving;History of Falls (last six months)             Mobility Comments: uses cane mostly for mobility, hx of falls due to difficulty lifting feet. uses rollator sometimes out in community (leaving [employer], etc) ADLs Comments: Indep with ADLs, shares cleaning with wife Occupational psychologist, etc), drives self to [facility]    OT Problem List: Decreased strength;Decreased activity tolerance;Impaired balance (sitting and/or standing);Decreased knowledge of use of DME or AE   OT Treatment/Interventions: Self-care/ADL training;Therapeutic exercise;Energy conservation;DME and/or AE instruction;Therapeutic activities;Patient/family education;Balance training      OT Goals(Current goals can be found in the care plan section)   Acute Rehab OT Goals Patient Stated Goal: declined inpatient rehab stay; opting for [facility] OT Goal Formulation: With patient/family Time For Goal Achievement: 03/18/24 Potential to Achieve Goals: Good   OT Frequency:  Min 2X/week    Co-evaluation              AM-PAC OT "6 Clicks" Daily Activity     Outcome Measure Help from another person eating meals?: None Help from another person taking care of personal  grooming?: A Little Help from another person toileting, which includes using toliet, bedpan, or urinal?: A Little Help from another person bathing (including washing, rinsing, drying)?: A Little Help from another person to put on and taking off regular upper body clothing?: A Little Help from another person to put on and taking off  regular lower body clothing?: A Little 6 Click Score: 19   End of Session Equipment Utilized During Treatment: Gait belt;Rolling walker (2 wheels) Nurse Communication: Mobility status  Activity Tolerance: Patient tolerated treatment well Patient left: in chair;with call bell/phone within reach;with chair alarm set  OT Visit Diagnosis: Unsteadiness on feet (R26.81);Other abnormalities of gait and mobility (R26.89);Muscle weakness (generalized) (M62.81);History of falling (Z91.81)                Time: 3086-5784 OT Time Calculation (min): 38 min Charges:  OT General Charges $OT Visit: 1 Visit OT Evaluation $OT Eval Low Complexity: 1 Low OT Treatments $Self Care/Home Management : 8-22 mins  Lawrence Pretty, OTR/L Acute Rehab Services Office: [phone]   Shireen Dory 03/04/2024, 9:52 AM

## 2024-03-04 NOTE — Progress Notes (Signed)
   03/04/24 2322  BiPAP/CPAP/SIPAP  BiPAP/CPAP/SIPAP Pt Type Adult  BiPAP/CPAP/SIPAP DREAMSTATIOND  FiO2 (%) 21 %  Patient Home Machine Yes  Safety Check Completed by RT for Home Unit Yes, no issues noted  Patient Home Mask Yes  Patient Home Tubing Yes  Auto Titrate Yes  CPAP/SIPAP surface wiped down Yes  BiPAP/CPAP /SiPAP Vitals  Pulse Rate (!) 46  Resp 15  SpO2 94 %  MEWS Score/Color  MEWS Score 1  MEWS Score Color Green

## 2024-03-04 NOTE — Progress Notes (Signed)
   03/04/24 1400  Mobility  Activity Transferred from chair to bed  Level of Assistance Minimal assist, patient does 75% or more  Assistive Device Front wheel walker  Activity Response Tolerated fair  Mobility Referral Yes  Mobility visit 1 Mobility  Mobility Specialist Start Time (ACUTE ONLY) 1400  Mobility Specialist Stop Time (ACUTE ONLY) 1418  Mobility Specialist Time Calculation (min) (ACUTE ONLY) 18 min   Mobility Specialist: Progress Note Post-Mobility:    HR 61, SpO2 94%  Pt agreeable to mobility session - received in chair. Pt was asymptomatic throughout session with no complaints. Returned to bed with all needs met - call bell within reach. Bed alarm on. Wife Present.   Richard Ruiz, BS Mobility Specialist Please contact via SecureChat or  Rehab office at [phone].

## 2024-03-04 NOTE — Discharge Instructions (Signed)
 Please ask PCP about [facility] or a local Endocrinologist.  If interesting in seeing a local Endocrinologist, please call and see which providers take your insurance and ask about getting an appointment to establish care.   [facility] Endocrinology [address] [place], [place], [place] [address] Phone: [phone]  [facility] Endocrinology @ [place] [address], [address] [place], [place] [address] Phone: [phone]

## 2024-03-05 DIAGNOSIS — R262 Difficulty in walking, not elsewhere classified: Secondary | ICD-10-CM | POA: Diagnosis not present

## 2024-03-05 LAB — BASIC METABOLIC PANEL WITH GFR
Anion gap: 10 (ref 5–15)
BUN: 15 mg/dL (ref 8–23)
CO2: 23 mmol/L (ref 22–32)
Calcium: 8.5 mg/dL — ABNORMAL LOW (ref 8.9–10.3)
Chloride: 99 mmol/L (ref 98–111)
Creatinine, Ser: 1.13 mg/dL (ref 0.61–1.24)
GFR, Estimated: >60 mL/min (ref >60)
Glucose, Bld: 239 mg/dL — ABNORMAL HIGH (ref 70–99)
Potassium: 3.5 mmol/L (ref 3.5–5.1)
Sodium: 132 mmol/L — ABNORMAL LOW (ref 135–145)

## 2024-03-05 LAB — URINE CULTURE: Culture: NO GROWTH

## 2024-03-05 LAB — CBC WITH DIFFERENTIAL/PLATELET
Abs Immature Granulocytes: 0.02 10*3/uL (ref 0.00–0.07)
Basophils Absolute: 0 10*3/uL (ref 0.0–0.1)
Basophils Relative: 1 %
Eosinophils Absolute: 0.2 10*3/uL (ref 0.0–0.5)
Eosinophils Relative: 3 %
HCT: 39.6 % (ref 39.0–52.0)
Hemoglobin: 13.9 g/dL (ref 13.0–17.0)
Immature Granulocytes: 0 %
Lymphocytes Relative: 29 %
Lymphs Abs: 1.8 10*3/uL (ref 0.7–4.0)
MCH: 31.7 pg (ref 26.0–34.0)
MCHC: 35.1 g/dL (ref 30.0–36.0)
MCV: 90.4 fL (ref 80.0–100.0)
Monocytes Absolute: 0.5 10*3/uL (ref 0.1–1.0)
Monocytes Relative: 8 %
Neutro Abs: 3.9 10*3/uL (ref 1.7–7.7)
Neutrophils Relative %: 59 %
Platelets: 323 10*3/uL (ref 150–400)
RBC: 4.38 MIL/uL (ref 4.22–5.81)
RDW: 12.8 % (ref 11.5–15.5)
WBC: 6.4 10*3/uL (ref 4.0–10.5)
nRBC: 0 % (ref 0.0–0.2)

## 2024-03-05 LAB — HEMOGLOBIN A1C
Hgb A1c MFr Bld: 11.8 % — ABNORMAL HIGH (ref 4.8–5.6)
Mean Plasma Glucose: 292 mg/dL

## 2024-03-05 LAB — GLUCOSE, CAPILLARY
Glucose-Capillary: 346 mg/dL — ABNORMAL HIGH (ref 70–99)
Glucose-Capillary: 368 mg/dL — ABNORMAL HIGH (ref 70–99)
Glucose-Capillary: 195 mg/dL — ABNORMAL HIGH (ref 70–99)
Glucose-Capillary: 220 mg/dL — ABNORMAL HIGH (ref 70–99)

## 2024-03-05 LAB — BRAIN NATRIURETIC PEPTIDE: B Natriuretic Peptide: 123.3 pg/mL — ABNORMAL HIGH (ref 0.0–100.0)

## 2024-03-05 LAB — MAGNESIUM: Magnesium: 2.1 mg/dL (ref 1.7–2.4)

## 2024-03-05 LAB — C-REACTIVE PROTEIN: CRP: 6.5 mg/dL — ABNORMAL HIGH (ref <1.0)

## 2024-03-05 LAB — PROCALCITONIN: Procalcitonin: <0.1 ng/mL

## 2024-03-05 MED ORDER — INSULIN GLARGINE-YFGN 100 UNIT/ML ~~LOC~~ SOLN
10.0000 [IU] | Freq: Once | SUBCUTANEOUS | Status: AC
  Administered 2024-03-05: 10 [IU] via SUBCUTANEOUS
  Filled 2024-03-05: qty 0.1

## 2024-03-05 MED ORDER — HYDRALAZINE HCL 20 MG/ML IJ SOLN
10.0000 mg | Freq: Four times a day (QID) | INTRAMUSCULAR | Status: DC | PRN
  Administered 2024-03-06: 10 mg via INTRAVENOUS
  Filled 2024-03-05: qty 1

## 2024-03-05 MED ORDER — INSULIN GLARGINE-YFGN 100 UNIT/ML ~~LOC~~ SOLN
28.0000 [IU] | Freq: Every day | SUBCUTANEOUS | Status: DC
  Administered 2024-03-06: 28 [IU] via SUBCUTANEOUS
  Filled 2024-03-05: qty 0.28

## 2024-03-05 NOTE — Inpatient Diabetes Management (Signed)
 Inpatient Diabetes Program Recommendations  AACE/ADA: New Consensus Statement on Inpatient Glycemic Control   Target Ranges:  Prepandial:   less than 140 mg/dL      Peak postprandial:   less than 180 mg/dL (1-2 hours)      Critically ill patients:  140 - 180 mg/dL    Latest Reference Range & Units 03/04/24 07:48 03/04/24 12:18 03/04/24 16:28 03/04/24 21:02 03/05/24 07:37 03/05/24 11:27  Glucose-Capillary 70 - 99 mg/dL 119 (H) 147 (H) 829 (H) 188 (H) 346 (H) 368 (H)    Latest Reference Range & Units 09/16/22 11:30 03/04/24 05:54  Hemoglobin A1C 4.8 - 5.6 % 9.3 (H) 11.8 (H)   Review of Glycemic Control  Diabetes history: DM2 Outpatient Diabetes medications: Lantus 22 units at bedtime, Ozempic 2 mg Qweek (Wednesday), Actos 15 mg QAM, Metformin 1000 mg BID, Jardiance 25 mg QAM (not taking) Current orders for Inpatient glycemic control: Semglee 28 units daily, Novolog 0-15 units TID with meals, Novolog 0-5 units at bedtime, Actos 15 mg QAM,  Inpatient Diabetes Recommendations:  Outpatient DM: At time of discharge, please provide Rx for FreeStyle Libre3 Plus sensors [phone]) and FreeStyle Libre3 reader [phone]).   NOTE: Patient interested in using CGM and Dr. Zelda Hickman provided order to give patient sample of FreeStyle Libre 3 Plus sensors. Spoke with patient regarding FreeStyle Libre 3 plus CGM.  Patient does not have a smart phone and plans to use the [employer] 3 Reader.  Educated patient on FreeStyle Libre3 CGM regarding application and changing CGM sensor (alternate every 14 days on back of arms), 1 hour warm-up, how to scan sensor to start a new sensor, and how to use app to check glucose.  Had patient watch YouTube video on how to apply FreeStyle Libre3 sensor.  Patient has been given Freestyle Libre3 sensor samples (2) and placed in belongings bag in closet. Patient will plan to start the sensor once he is discharged and gets the [facility] 3 reader.  Provided educational packet  regarding FreeStyle Libre3 CGM.   Informed patient that it would be requested that attending provider provide Rx for first month of FreeStyle Libre3 sensors and that he have PCP continue to provide Rx for FreeStyle Libre3 sensors going forward. Asked patient to be sure to let PCP know about [facility] and allow provider to review reports from [place] app so the provider can use the information to continue to make adjustments with DM medications if needed. Stressed importance of getting DM under better control. Encouraged patient to follow up with PCP regarding DM and to ask about referral to Endocrinologist or call local Endocrinology to get established with them.   Patient verbalized understanding of information and has no questions at this time.  Thanks, Beacher Limerick, RN, MSN, CDCES Diabetes Coordinator Inpatient Diabetes Program [phone] (Team Pager from 8am to 5pm)

## 2024-03-05 NOTE — Progress Notes (Signed)
   03/05/24 0954  Mobility  Activity Ambulated with assistance in hallway;Ambulated with assistance to bathroom  Level of Assistance Contact guard assist, steadying assist  Assistive Device Front wheel walker  Distance Ambulated (ft) 100 ft  Activity Response Tolerated well  Mobility Referral Yes  Mobility visit 1 Mobility  Mobility Specialist Start Time (ACUTE ONLY) 0954  Mobility Specialist Stop Time (ACUTE ONLY) 1010  Mobility Specialist Time Calculation (min) (ACUTE ONLY) 16 min   Mobility Specialist: Progress Note- Visits:2  Pt agreeable to mobility session - received in bed. Pt was asymptomatic throughout session with no complaints. Pt needing to have BM, no output.  Returned to chair with all needs met - call bell within reach. Chair alarm on.   _____________________________________________________________________________  Pt agreeable to mobility session - received in chair. Pt was asymptomatic throughout session with no complaints. Returned to bed with all needs met - call bell within reach. Bed alarm on. Wife Present.   Isla Mari, BS Mobility Specialist Please contact via SecureChat or  Rehab office at [phone].

## 2024-03-05 NOTE — Progress Notes (Signed)
 PROGRESS NOTE                                                                                                                                                                                                             Patient Demographics:    Richard Ruiz, is a 78 y.o. male, DOB - 1946-09-06, ZOX:[id number]  [facility]patient [facility] the [facility]    LOS - 1  Admit date - 03/02/2024    Chief Complaint  Patient presents with   Gait Problem       Brief Narrative (HPI from H&P)   78 y.o. male with medical history significant of hypertension, hyperlipidemia, diabetes, chronic peripheral neuropathy, hypothyroidism, CAD, BPH, fatty liver, obesity, OSA, PTSD, migraines, cataract, muscle weakness presenting for gait abnormality and worsening weakness.  Apparently patient has had similar episode related to UTI few months ago, he was brought in by family members for 2 to 3-day history of acute on chronic generalized weakness mild confusion, weight consistent with UTI.  He was admitted for further care.   Subjective:   Patient in bed, appears comfortable, denies any headache, no fever, no chest pain or pressure, no shortness of breath , no abdominal pain. No focal weakness.  Assessment  & Plan :    UTI causing acute on chronic generalized weakness in a patient with advanced chronic peripheral neuropathy with mild metabolic encephalopathy at the time of admission. He has had similar episode few months ago related to UTI, continue Rocephin, continue IV fluids, follow urine cultures, PT OT, head CT stable, no headache or focal deficits, continue to monitor closely.  Mentation back to normal.  Require outpatient urology follow-up.  Niccoli much improved likely discharge in the next 1 to 2 days.   Hypertension.  Home medications resumed.  Add as needed hydralazine.  Dyslipidemia.  On statin.  Hypothyroidism.  On  home dose Synthroid, stable TSH and free T4  Diabetic peripheral neuropathy.  Has diffuse generalized weakness, continue Neurontin, check TSH, B12, outpatient neurology follow-up.  PT-OT here.  CAD.  Continue home aspirin, beta-blocker, Imdur and ARB combination.  No acute issues.  OSA.  Nighttime CPAP.  Hypomagnesemia.  Replaced.    DM type II.  In poor control.  A1c recheck, on Glucotrol with Lantus at home which will be resumed, ISS  added, diabetic insulin education, insulin dose adjusted further on 03/05/2024 for better control.    Lab Results  Component Value Date   HGBA1C 11.8 (H) 03/04/2024   CBG (last 3)  Recent Labs    03/04/24 1628 03/04/24 2102 03/05/24 0737  GLUCAP 242* 188* 346*          Condition - Fair  Family Communication  : Son bedside on 03/04/2024  Code Status :  Full  Consults  :  None  PUD Prophylaxis : PPI   Procedures  :     CT head.  Nonacute      Disposition Plan  :    Status is: Observation  DVT Prophylaxis  :  Lovenox  enoxaparin (LOVENOX) injection 40 mg Start: 03/04/24 0915 SCDs Start: 03/03/24 1724    Lab Results  Component Value Date   PLT 323 03/05/2024    Diet :  Diet Order             Diet Carb Modified Fluid consistency: Thin; Room service appropriate? Yes  Diet effective now                    Inpatient Medications  Scheduled Meds:  aspirin EC  81 mg Oral q AM   atorvastatin  40 mg Oral QHS   enoxaparin (LOVENOX) injection  40 mg Subcutaneous Q24H   insulin aspart  0-15 Units Subcutaneous TID WC   insulin aspart  0-5 Units Subcutaneous QHS   insulin glargine-yfgn  10 Units Subcutaneous Once   [START ON 03/06/2024] insulin glargine-yfgn  28 Units Subcutaneous Daily   isosorbide mononitrate  60 mg Oral q AM   levothyroxine  150 mcg Oral Q0600   losartan  100 mg Oral q AM   metoprolol succinate  100 mg Oral q AM   pantoprazole  40 mg Oral Daily   pioglitazone  15 mg Oral q AM   sodium chloride  flush  3 mL Intravenous Q12H   Continuous Infusions:  cefTRIAXone (ROCEPHIN)  IV 1 g (03/05/24 0845)   PRN Meds:.acetaminophen **OR** acetaminophen, gabapentin **AND** [DISCONTINUED] gabapentin, nitroGLYCERIN, polyethylene glycol  Antibiotics  :    Anti-infectives (From admission, onward)    Start     Dose/Rate Route Frequency Ordered Stop   03/04/24 0800  cefTRIAXone (ROCEPHIN) 1 g in sodium chloride 0.9 % 100 mL IVPB        1 g 200 mL/hr over 30 Minutes Intravenous Every 24 hours 03/03/24 1905     03/03/24 0800  cefTRIAXone (ROCEPHIN) 1 g in sodium chloride 0.9 % 100 mL IVPB        1 g 200 mL/hr over 30 Minutes Intravenous  Once 03/03/24 0759 03/03/24 0841         Objective:   Vitals:   03/05/24 0123 03/05/24 0337 03/05/24 0400 03/05/24 0738  BP: (!) 151/87 132/67 138/87 (!) 140/82  Pulse: (!) 50 (!) 50 (!) 48 64  Resp: 13 20 17 20   Temp:  98 F (36.7 C)  (!) 97.5 F (36.4 C)  TempSrc:  Oral  Oral  SpO2: 95% 94% 94% 92%  Weight:      Height:        Wt Readings from Last 3 Encounters:  03/02/24 90.7 kg  12/13/23 86.2 kg  09/16/22 91.9 kg     Intake/Output Summary (Last 24 hours) at 03/05/2024 0924 Last data filed at 03/05/2024 0836 Gross per 24 hour  Intake 491.95 ml  Output 950 ml  Net -458.05 ml     Physical Exam  Awake Alert, No new F.N deficits, Normal affect [place].AT,PERRAL Supple Neck, No JVD,   Symmetrical Chest wall movement, Good air movement bilaterally, CTAB RRR,No Gallops,Rubs or new Murmurs,  +ve B.Sounds, Abd Soft, No tenderness,   No Cyanosis, Clubbing or edema     Data Review:    Recent Labs  Lab 03/03/24 0005 03/04/24 0554 03/05/24 0525  WBC 9.5 7.2 6.4  HGB 15.8 15.0 13.9  HCT 44.8 42.4 39.6  PLT 386 342 323  MCV 90.3 90.4 90.4  MCH 31.9 32.0 31.7  MCHC 35.3 35.4 35.1  RDW 12.9 12.9 12.8  LYMPHSABS  --  1.6 1.8  MONOABS  --  0.7 0.5  EOSABS  --  0.0 0.2  BASOSABS  --  0.1 0.0    Recent Labs  Lab 03/03/24 0005  03/03/24 0430 03/04/24 0554 03/05/24 0525  NA 133*  --  131* 132*  K 4.2  --  4.0 3.5  CL 95*  --  97* 99  CO2 25  --  25 23  ANIONGAP 13  --  9 10  GLUCOSE 304*  --  340* 239*  BUN 14  --  11 15  CREATININE 1.18  --  1.10 1.13  AST 19  --   --   --   ALT 12  --   --   --   ALKPHOS 106  --   --   --   BILITOT 0.8  --   --   --   ALBUMIN 4.3  --   --   --   CRP  --   --  13.1* 6.5*  PROCALCITON  --   --  0.14 <0.10  TSH  --  4.960* 8.600*  --   HGBA1C  --   --  11.8*  --   AMMONIA  --  18  --   --   BNP  --   --  160.3* 123.3*  MG  --   --  1.6* 2.1  CALCIUM 10.2  --  9.1 8.5*      Recent Labs  Lab 03/03/24 0005 03/03/24 0430 03/04/24 0554 03/05/24 0525  CRP  --   --  13.1* 6.5*  PROCALCITON  --   --  0.14 <0.10  TSH  --  4.960* 8.600*  --   HGBA1C  --   --  11.8*  --   AMMONIA  --  18  --   --   BNP  --   --  160.3* 123.3*  MG  --   --  1.6* 2.1  CALCIUM 10.2  --  9.1 8.5*    --------------------------------------------------------------------------------------------------------------- No results found for: "CHOL", "HDL", "LDLCALC", "LDLDIRECT", "TRIG", "CHOLHDL"  Lab Results  Component Value Date   HGBA1C 11.8 (H) 03/04/2024    Micro Results Recent Results (from the past 240 hours)  Resp panel by RT-PCR (RSV, Flu A&B, Covid) Anterior Nasal Swab     Status: None   Collection Time: 03/03/24  5:53 PM   Specimen: Anterior Nasal Swab  Result Value Ref Range Status   SARS Coronavirus 2 by RT PCR NEGATIVE NEGATIVE Final   Influenza A by PCR NEGATIVE NEGATIVE Final   Influenza B by PCR NEGATIVE NEGATIVE Final    Comment: (NOTE) The Xpert Xpress SARS-CoV-2/FLU/RSV plus assay is intended as an aid in the diagnosis of influenza from Nasopharyngeal swab specimens and should not be used as a sole  basis for treatment. Nasal washings and aspirates are unacceptable for Xpert Xpress SARS-CoV-2/FLU/RSV testing.  Fact Sheet for  Patients: BloggerCourse.com  Fact Sheet for Healthcare Providers: SeriousBroker.it  This test is not yet approved or cleared by the [place]  FDA and has been authorized for detection and/or diagnosis of SARS-CoV-2 by FDA under an Emergency Use Authorization (EUA). This EUA will remain in effect (meaning this test can be used) for the duration of the COVID-19 declaration under Section 564(b)(1) of the Act, 21 U.S.C. section 360bbb-3(b)(1), unless the authorization is terminated or revoked.     Resp Syncytial Virus by PCR NEGATIVE NEGATIVE Final    Comment: (NOTE) Fact Sheet for Patients: BloggerCourse.com  Fact Sheet for Healthcare Providers: SeriousBroker.it  This test is not yet approved or cleared by the [place]  FDA and has been authorized for detection and/or diagnosis of SARS-CoV-2 by FDA under an Emergency Use Authorization (EUA). This EUA will remain in effect (meaning this test can be used) for the duration of the COVID-19 declaration under Section 564(b)(1) of the Act, 21 U.S.C. section 360bbb-3(b)(1), unless the authorization is terminated or revoked.  Performed at [facility], [address], [place], [place] [address]   MRSA Next Gen by PCR, Nasal     Status: None   Collection Time: 03/04/24  5:33 AM   Specimen: Nasal Mucosa; Nasal Swab  Result Value Ref Range Status   MRSA by PCR Next Gen NOT DETECTED NOT DETECTED Final    Comment: (NOTE) The GeneXpert MRSA Assay (FDA approved for NASAL specimens only), is one component of a comprehensive MRSA colonization surveillance program. It is not intended to diagnose MRSA infection nor to guide or monitor treatment for MRSA infections. Test performance is not FDA approved in patients less than 67 years old. Performed at [facility], [address] [place], [place] [address]     Radiology  Report No results found.    Signature  -   Lynnwood Sauer M.D on 03/05/2024 at 9:24 AM   -  To page go to [email or web address]

## 2024-03-05 NOTE — Plan of Care (Signed)
   Problem: Education: Goal: Knowledge of General Education information will improve Description Including pain rating scale, medication(s)/side effects and non-pharmacologic comfort measures Outcome: Progressing

## 2024-03-05 NOTE — Progress Notes (Signed)
 Occupational Therapy Treatment Patient Details Name: Richard Ruiz MRN: [id number] DOB: 09-Mar-1946 Today's Date: 03/05/2024   History of present illness Pt is a 78 y/o male presenting with progressive weakness and difficulty walking. Found to have UTI. PMH: HTN, HLD, DM, neuropathy, hypothyroidism, CAD, BPH, fatty liver, OSA, PTSD, migraines, cataract.   OT comments  Pt making good progress towards OT goals and eager to participate. Pt requiring no more than CGA for standing/bathroom mobility using RW with fair carryover of education regarding proximity to RW w/ mobility. Pt able to manage toileting task and ADLs standing at sink with Supervision-CGA. Minor memory deficits noted during session today. Provided fall prevention handout for pt review. Continue to recommend HHOT.      If plan is discharge home, recommend the following:  A little help with walking and/or transfers;A little help with bathing/dressing/bathroom;Assistance with cooking/housework   Equipment Recommendations  Other (comment) (Rolling walker)    Recommendations for Other Services      Precautions / Restrictions Precautions Precautions: Fall Restrictions Weight Bearing Restrictions Per Provider Order: No       Mobility Bed Mobility Overal bed mobility: Needs Assistance Bed Mobility: Supine to Sit     Supine to sit: Supervision, HOB elevated, Used rails          Transfers Overall transfer level: Needs assistance Equipment used: Rolling walker (2 wheels) Transfers: Sit to/from Stand Sit to Stand: Supervision           General transfer comment: cued to push from bed with pt able to stand without assist. Able to stand from raised toilet seat w/o issues     Balance Overall balance assessment: Needs assistance Sitting-balance support: No upper extremity supported, Feet supported Sitting balance-Leahy Scale: Good     Standing balance support: Bilateral upper extremity supported, During functional  activity Standing balance-Leahy Scale: Poor Standing balance comment: posterior sway when not holding on to DME or sturdy surfaces                           ADL either performed or assessed with clinical judgement   ADL Overall ADL's : Needs assistance/impaired     Grooming: Supervision/safety;Standing;Oral care;Wash/dry face;Wash/dry hands Grooming Details (indicate cue type and reason): standing at sink. minor sway with unsupported standing but able to self correct                 Toilet Transfer: Contact guard assist;Ambulation;Rolling walker (2 wheels);BSC/3in1 Toilet Transfer Details (indicate cue type and reason): BSC over toilet d/t pt with raised seat at home. able to stand from toilet without assist. fair negotiation of RW in/out of bathroom. Toileting- Clothing Manipulation and Hygiene: Contact guard assist;Sitting/lateral lean;Sit to/from stand Toileting - Clothing Manipulation Details (indicate cue type and reason): able to stand and perform hygiene, improved balance with at least one UE support     Functional mobility during ADLs: Contact guard assist;Rolling walker (2 wheels) General ADL Comments: Provided fall prevention handout    Extremity/Trunk Assessment Upper Extremity Assessment Upper Extremity Assessment: Overall WFL for tasks assessed;Right hand dominant   Lower Extremity Assessment Lower Extremity Assessment: Defer to PT evaluation        Vision   Vision Assessment?: No apparent visual deficits   Perception     Praxis     Communication Communication Communication: No apparent difficulties   Cognition Arousal: Alert Behavior During Therapy: WFL for tasks assessed/performed Cognition: Cognition impaired     Awareness:  Intellectual awareness intact, Online awareness impaired Memory impairment (select all impairments): Short-term memory, Declarative long-term memory Attention impairment (select first level of impairment):  Selective attention Executive functioning impairment (select all impairments): Problem solving, Sequencing OT - Cognition Comments: per son, much improved cognition compared to admission. Pt able to show insight into deficits though minor cues needed to correct DME use, problem solve, etc. Witty humor, appropriate sequencing of ADLs. did show memory deficits as pt with difficulty recalling information from yesterday's session                 Following commands: Impaired Following commands impaired: Only follows one step commands consistently, Follows one step commands with increased time, Follows multi-step commands with increased time      Cueing   Cueing Techniques: Verbal cues, Gestural cues  Exercises      Shoulder Instructions       General Comments      Pertinent Vitals/ Pain       Pain Assessment Pain Assessment: No/denies pain  Home Living                                          Prior Functioning/Environment              Frequency  Min 2X/week        Progress Toward Goals  OT Goals(current goals can now be found in the care plan section)  Progress towards OT goals: Progressing toward goals  Acute Rehab OT Goals Patient Stated Goal: go home tomorrow, work on my walking OT Goal Formulation: With patient/family Time For Goal Achievement: 03/18/24 Potential to Achieve Goals: Good ADL Goals Pt Will Perform Lower Body Dressing: with modified independence;with adaptive equipment;sitting/lateral leans;sit to/from stand Pt Will Transfer to Toilet: with modified independence;ambulating Pt Will Perform Toileting - Clothing Manipulation and hygiene: with modified independence;sitting/lateral leans;sit to/from stand Additional ADL Goal #1: Pt to verbalize at least 3 fall prevention strategies to implement at home.  Plan      Co-evaluation                 AM-PAC OT "6 Clicks" Daily Activity     Outcome Measure   Help from  another person eating meals?: None Help from another person taking care of personal grooming?: A Little Help from another person toileting, which includes using toliet, bedpan, or urinal?: A Little Help from another person bathing (including washing, rinsing, drying)?: A Little Help from another person to put on and taking off regular upper body clothing?: A Little Help from another person to put on and taking off regular lower body clothing?: A Little 6 Click Score: 19    End of Session Equipment Utilized During Treatment: Gait belt;Rolling walker (2 wheels)  OT Visit Diagnosis: Unsteadiness on feet (R26.81);Other abnormalities of gait and mobility (R26.89);Muscle weakness (generalized) (M62.81);History of falling (Z91.81)   Activity Tolerance Patient tolerated treatment well   Patient Left in chair;with call bell/phone within reach;with chair alarm set   Nurse Communication Mobility status;Other (comment) (discussed with NT)        Time: 1610-9604 OT Time Calculation (min): 30 min  Charges: OT General Charges $OT Visit: 1 Visit OT Treatments $Self Care/Home Management : 23-37 mins  Lawrence Pretty, OTR/L Acute Rehab Services Office: [phone]   Shireen Dory 03/05/2024, 9:24 AM

## 2024-03-06 ENCOUNTER — Other Ambulatory Visit (HOSPITAL_COMMUNITY): Payer: Self-pay

## 2024-03-06 DIAGNOSIS — R262 Difficulty in walking, not elsewhere classified: Secondary | ICD-10-CM | POA: Diagnosis not present

## 2024-03-06 LAB — CBC WITH DIFFERENTIAL/PLATELET
Abs Immature Granulocytes: 0.01 10*3/uL (ref 0.00–0.07)
Basophils Absolute: 0.1 10*3/uL (ref 0.0–0.1)
Basophils Relative: 1 %
Eosinophils Absolute: 0.1 10*3/uL (ref 0.0–0.5)
Eosinophils Relative: 2 %
HCT: 42.2 % (ref 39.0–52.0)
Hemoglobin: 14.8 g/dL (ref 13.0–17.0)
Immature Granulocytes: 0 %
Lymphocytes Relative: 28 %
Lymphs Abs: 2 10*3/uL (ref 0.7–4.0)
MCH: 31.4 pg (ref 26.0–34.0)
MCHC: 35.1 g/dL (ref 30.0–36.0)
MCV: 89.6 fL (ref 80.0–100.0)
Monocytes Absolute: 0.4 10*3/uL (ref 0.1–1.0)
Monocytes Relative: 6 %
Neutro Abs: 4.6 10*3/uL (ref 1.7–7.7)
Neutrophils Relative %: 63 %
Platelets: 420 10*3/uL — ABNORMAL HIGH (ref 150–400)
RBC: 4.71 MIL/uL (ref 4.22–5.81)
RDW: 12.9 % (ref 11.5–15.5)
WBC: 7.2 10*3/uL (ref 4.0–10.5)
nRBC: 0 % (ref 0.0–0.2)

## 2024-03-06 LAB — BASIC METABOLIC PANEL WITH GFR
Anion gap: 9 (ref 5–15)
BUN: 14 mg/dL (ref 8–23)
CO2: 24 mmol/L (ref 22–32)
Calcium: 9.6 mg/dL (ref 8.9–10.3)
Chloride: 104 mmol/L (ref 98–111)
Creatinine, Ser: 1.03 mg/dL (ref 0.61–1.24)
GFR, Estimated: >60 mL/min (ref >60)
Glucose, Bld: 213 mg/dL — ABNORMAL HIGH (ref 70–99)
Potassium: 3.3 mmol/L — ABNORMAL LOW (ref 3.5–5.1)
Sodium: 137 mmol/L (ref 135–145)

## 2024-03-06 LAB — GLUCOSE, CAPILLARY: Glucose-Capillary: 215 mg/dL — ABNORMAL HIGH (ref 70–99)

## 2024-03-06 LAB — PROCALCITONIN: Procalcitonin: <0.1 ng/mL

## 2024-03-06 LAB — MAGNESIUM: Magnesium: 1.9 mg/dL (ref 1.7–2.4)

## 2024-03-06 LAB — C-REACTIVE PROTEIN: CRP: 2.6 mg/dL — ABNORMAL HIGH (ref <1.0)

## 2024-03-06 LAB — BRAIN NATRIURETIC PEPTIDE: B Natriuretic Peptide: 89.1 pg/mL (ref 0.0–100.0)

## 2024-03-06 MED ORDER — POTASSIUM CHLORIDE CRYS ER 20 MEQ PO TBCR
40.0000 meq | EXTENDED_RELEASE_TABLET | Freq: Four times a day (QID) | ORAL | Status: DC
  Administered 2024-03-06: 40 meq via ORAL
  Filled 2024-03-06: qty 2

## 2024-03-06 MED ORDER — FREESTYLE LIBRE 3 PLUS SENSOR MISC
0 refills | Status: DC
  Filled 2024-03-06: qty 1, 15d supply, fill #0

## 2024-03-06 MED ORDER — FREESTYLE LIBRE 3 READER DEVI
0 refills | Status: AC
  Filled 2024-03-06: qty 1, 15d supply, fill #0

## 2024-03-06 MED ORDER — METOPROLOL SUCCINATE ER 100 MG PO TB24: 100.0000 mg | ORAL_TABLET | Freq: Every morning | ORAL | Status: DC

## 2024-03-06 MED ORDER — VITAMIN B-12 1000 MCG PO TABS
1000.0000 ug | ORAL_TABLET | Freq: Every day | ORAL | 0 refills | Status: AC
  Filled 2024-03-06: qty 47, 47d supply, fill #0

## 2024-03-06 MED ORDER — INSULIN GLARGINE-YFGN 100 UNIT/ML ~~LOC~~ SOLN: 30.0000 [IU] | Freq: Every day | SUBCUTANEOUS | Status: DC

## 2024-03-06 NOTE — Progress Notes (Signed)
 Physical Therapy Treatment Patient Details Name: Richard Ruiz MRN: [id number] DOB: 1946-02-19 Today's Date: 03/06/2024   History of Present Illness Pt is a 78 y/o male presenting with progressive weakness and difficulty walking. Found to have UTI. PMH: HTN, HLD, DM, neuropathy, hypothyroidism, CAD, BPH, fatty liver, OSA, PTSD, migraines, cataract.    PT Comments  Patient making excellent progress with mobility and demonstrated good recall for safe technique with sit<>stand from recliner. Belt utilized on [employer] as visual cue for step height and length. Gait improved with increased dorsiflexion, hip flexion, and overall step length with belt, cues intermittently to advance walker further to increase step length. No posterior lean noted today and with improved foot clearance no shuffled steps that were leading to anterior LOB. CGA for safety today with gait and amb ~120', VSS throughout on RA. EOS pt agreeable to remain OOB. Will continue to progress in acute setting as able.   If plan is discharge home, recommend the following: A little help with walking and/or transfers;A little help with bathing/dressing/bathroom;Assistance with cooking/housework;Assist for transportation;Help with stairs or ramp for entrance   Can travel by private vehicle        Equipment Recommendations  None recommended by PT    Recommendations for Other Services       Precautions / Restrictions Precautions Precautions: Fall Recall of Precautions/Restrictions: Intact Precaution/Restrictions Comments: tendency to fall forward Restrictions Weight Bearing Restrictions Per Provider Order: No     Mobility  Bed Mobility               General bed mobility comments: pt OOB in recliner    Transfers Overall transfer level: Needs assistance Equipment used: Rolling walker (2 wheels), None Transfers: Sit to/from Stand Sit to Stand: Supervision           General transfer comment: good recall for use of hands  to power up and placement on RW. good reach back. sup for safety, no significant posterior lean noted    Ambulation/Gait Ambulation/Gait assistance: Contact guard assist Gait Distance (Feet): 120 Feet Assistive device: Rolling walker (2 wheels) Gait Pattern/deviations: Step-through pattern, Decreased stride length, Decreased dorsiflexion - left, Decreased dorsiflexion - right Gait velocity: decr     General Gait Details: belt in place on RW for visual cue to lift knees for every step, verbal cue to remind pt. improved step length, height, and dorsiflexion/foot clearance with each step. no overt LOB throughout and pace improved, no posterior lean.   Stairs             Wheelchair Mobility     Tilt Bed    Modified Rankin (Stroke Patients Only)       Balance Overall balance assessment: Needs assistance Sitting-balance support: No upper extremity supported, Feet supported Sitting balance-Leahy Scale: Good     Standing balance support: Bilateral upper extremity supported, During functional activity Standing balance-Leahy Scale: Poor                              Communication Communication Communication: No apparent difficulties  Cognition Arousal: Alert Behavior During Therapy: WFL for tasks assessed/performed   PT - Cognitive impairments: No apparent impairments                         Following commands: Intact      Cueing Cueing Techniques: Verbal cues  Exercises      General Comments  Pertinent Vitals/Pain Pain Assessment Pain Assessment: No/denies pain    Home Living                          Prior Function            PT Goals (current goals can now be found in the care plan section) Acute Rehab PT Goals Patient Stated Goal: stop falling, improve balance PT Goal Formulation: With patient Time For Goal Achievement: 03/18/24 Potential to Achieve Goals: Good Progress towards PT goals: Progressing toward  goals    Frequency    Min 2X/week      PT Plan      Co-evaluation              AM-PAC PT 6 Clicks Mobility   Outcome Measure  Help needed turning from your back to your side while in a flat bed without using bedrails?: A Little Help needed moving from lying on your back to sitting on the side of a flat bed without using bedrails?: A Little Help needed moving to and from a bed to a chair (including a wheelchair)?: A Little Help needed standing up from a chair using your arms (e.g., wheelchair or bedside chair)?: A Little Help needed to walk in hospital room?: A Little Help needed climbing 3-5 steps with a railing? : A Little 6 Click Score: 18    End of Session Equipment Utilized During Treatment: Gait belt Activity Tolerance: Patient tolerated treatment well Patient left: in chair;with call bell/phone within reach;with chair alarm set Nurse Communication: Mobility status PT Visit Diagnosis: Muscle weakness (generalized) (M62.81);Difficulty in walking, not elsewhere classified (R26.2);Unsteadiness on feet (R26.81);Other abnormalities of gait and mobility (R26.89)     Time: 1610-9604 PT Time Calculation (min) (ACUTE ONLY): 13 min  Charges:    $Gait Training: 8-22 mins PT General Charges $$ ACUTE PT VISIT: 1 Visit                     Tish Forge, DPT Acute Rehabilitation Services Office [phone]  03/06/24 4:47 PM

## 2024-03-06 NOTE — TOC Transition Note (Signed)
 Transition of Care [facility]) - Discharge Note   Patient Details  Name: Richard Ruiz MRN: [id number] Date of Birth: [birth year]  [facility] CM/SW Contact:  Eusebio High, RN Phone Number: 03/06/2024, 9:38 AM   Clinical Narrative:     Patient will DC to home today. Home Health will be provided by [facility]. Patient does not need any DME. Wife will transport       Barriers to Discharge: Continued Medical Work up   Patient Goals and CMS Choice   CMS Medicare.gov Compare Post Acute Care list provided to:: Patient Choice offered to / list presented to : Patient, Spouse      Discharge Placement                       Discharge Plan and Services Additional resources added to the After Visit Summary for     Discharge Planning Services: CM Consult Post Acute Care Choice: Home Health                    HH Arranged: PT, OT [facility] Agency: [facility] Date [facility] Agency Contacted: 03/04/24   Representative spoke with at [facility] Agency: Randel Buss  Social Drivers of Health (SDOH) Interventions SDOH Screenings   Food Insecurity: No Food Insecurity (03/04/2024)  Housing: Unknown (03/04/2024)  Transportation Needs: No Transportation Needs (03/04/2024)  Utilities: Not At Risk (03/04/2024)  Social Connections: Unknown (03/04/2024)  Tobacco Use: Medium Risk (03/02/2024)     Readmission Risk Interventions     No data to display

## 2024-03-06 NOTE — Discharge Summary (Signed)
 Physician Discharge Summary  PATTRICK BADY WUJ:[id number] DOB: [birth year] DOA: 03/02/2024  PCP: [facility]  Admit date: 03/02/2024 Discharge date: 03/06/2024  Admitted From: (Home) Disposition:  (Home )  Recommendations for Outpatient Follow-up:  Follow up with PCP in 1-2 weeks Please obtain BMP/CBC in one week Recheck TSH level and free T4 as an outpatient Please check b12 level in 4 to 6 weeks and oral supplement and adjust as needed.  Home Health: (YES)   Diet recommendation: Heart Healthy / Carb Modified   Brief/Interim Summary:  78 y.o. male with medical history significant of hypertension, hyperlipidemia, diabetes, chronic peripheral neuropathy, hypothyroidism, CAD, BPH, fatty liver, obesity, OSA, PTSD, migraines, cataract, muscle weakness presenting for gait abnormality and worsening weakness.  Apparently patient has had similar episode related to UTI few months ago, he was brought in by family members for 2 to 3-day history of acute on chronic generalized weakness mild confusion, UA consistent with UTI.  He was admitted for further care.   UTI - He was treated with IV Rocephin x 4 days with no further need for antibiotics at time of discharge.  Deconditioning Ambulatory dysfunction  -  UTI causing acute on chronic generalized weakness in a patient with advanced chronic peripheral neuropathy with mild metabolic encephalopathy at the time of admission.  - He has had similar episode few months ago related to UTI, head CT stable, no headache or focal deficits,   Mentation back to normal.  - Home health has been a clearance at time of discharge   Acute metabolic encephalopathy -On admission, secondary to UTI, this has resolved, CT head stable  Hypertension.  Home medications resumed.     Dyslipidemia.  On statin.   Hypothyroidism.   On home dose Synthroid, TSH is mildly elevated at 4.960, I will hold on adjusted regimen as possible it is a sick euthyroid  syndrome, this need to be followed as an outpatient.  B12 deficiency -B12 level low at 240, started on p.o. supplement, continue to monitor as an outpatient    Diabetic peripheral neuropathy.   -Has diffuse generalized weakness, continue Neurontin,  CAD.  Continue home aspirin, beta-blocker, Imdur and ARB combination.  No acute issues.   OSA.  Nighttime CPAP.   Hypomagnesemia.   Hypokalemia  replaced.     DM type II.  Poorly controlled, with hyperglycemia in poor control.  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 military combat   Coronary artery disease   Essential hypertension   Mixed hyperlipidemia   Hypothyroidism   Obesity   UTI (urinary tract infection)    Discharge Instructions  Discharge Instructions     Diet - low sodium heart healthy   Complete by: As directed    Increase activity slowly   Complete by: As directed       Allergies as of 03/06/2024       Reactions   Codeine Itching   Gabapentin Other (See Comments)   Jittery   Lisinopril Cough        Medication List     STOP taking  these medications    empagliflozin 25 MG Tabs tablet Commonly known as: JARDIANCE       TAKE these medications    aspirin EC 81 MG tablet Take 81 mg by mouth in the morning.   atorvastatin 80 MG tablet Commonly known as: LIPITOR Take 80 mg by mouth at bedtime.   cetirizine 10 MG tablet Commonly known as: ZYRTEC Take 10 mg by mouth daily.   DULoxetine  30 MG capsule Commonly known as: CYMBALTA  Take 90 mg by mouth in  the morning.   fluticasone 50 MCG/ACT nasal spray Commonly known as: FLONASE Place 1 spray into both nostrils daily as needed (nasal congestion.).   FreeStyle Libre 3 Plus Sensor Misc Change sensor every 15 days.   FreeStyle Libre 3 Reader Seymour Dapper Please provide Rx for FreeStyle Libre3 Plus sensors [id number]) and FreeStyle Libre3 reader [phone]).   gabapentin 300 MG capsule Commonly known as: NEURONTIN Take 300 mg by mouth 3 (three) times daily.   insulin glargine-yfgn 100 UNIT/ML injection Commonly known as: SEMGLEE Inject 0.3 mLs (30 Units total) into the skin at bedtime. What changed: how much to take   isosorbide mononitrate 60 MG 24 hr tablet Commonly known as: IMDUR Take 60 mg by mouth in the morning.   levothyroxine 100 MCG tablet Commonly known as: SYNTHROID Take 100 mcg by mouth daily before breakfast.   losartan 100 MG tablet Commonly known as: COZAAR Take 100 mg by mouth in the morning.   metFORMIN 1000 MG tablet Commonly known as: GLUCOPHAGE Take 1,000 mg by mouth 2 (two) times daily.   metoprolol succinate 100 MG 24 hr tablet Commonly known as: TOPROL-XL Take 1 tablet (100 mg total) by mouth in the morning.   multivitamin tablet Take 1 tablet by mouth in the morning.   nitroGLYCERIN 0.4 MG SL tablet Commonly known as: NITROSTAT Place 0.4 mg under the tongue every 5 (five) minutes x 3 doses as needed for chest pain.   omeprazole 40 MG capsule Commonly known as: PRILOSEC Take 40 mg by mouth daily before breakfast.   pioglitazone 30 MG tablet Commonly known as: ACTOS Take 30 mg by mouth in the morning.   SEMAGLUTIDE (1 MG/DOSE) [place] Inject 1 mg into the skin every Wednesday.   vitamin D3 25 MCG tablet Commonly known as: CHOLECALCIFEROL Take 2,000 Units by mouth at bedtime.        Follow-up Information     [facility] Follow up.   Specialty: Home Health Services Why: Gasper Karst will contact you for the first home visit Contact  information: [address] [place] [address] [phone]                Allergies  Allergen Reactions   Codeine Itching   Gabapentin Other (See Comments)    Jittery   Lisinopril Cough    Consultations: none   Procedures/Studies: DG Chest Port 1 View Result Date: 03/03/2024 CLINICAL DATA:  Fall several hours ago, initial encounter EXAM: PORTABLE CHEST 1 VIEW COMPARISON:  09/16/2013 FINDINGS: Cardiac shadow is within normal limits. Lungs are clear bilaterally. No bony abnormality is seen. Postsurgical changes in the lower neck are noted. IMPRESSION: No active disease. Electronically Signed   By: Violeta Grey M.D.   On: 03/03/2024 00:46   CT Head Wo Contrast Result Date: 03/03/2024 CLINICAL DATA:  Inability to speak or walk. EXAM: CT HEAD WITHOUT CONTRAST TECHNIQUE: Contiguous axial images were obtained from the base of the skull through the vertex without  intravenous contrast. RADIATION DOSE REDUCTION: This exam was performed according to the departmental dose-optimization program which includes automated exposure control, adjustment of the mA and/or kV according to patient size and/or use of iterative reconstruction technique. COMPARISON:  None Available. FINDINGS: Brain: No evidence of acute infarction, hemorrhage, extra-axial collection or mass lesion/mass effect. Mild prominence of the lateral ventricles is noted. Vascular: No hyperdense vessel or unexpected calcification. Skull: Normal. Negative for fracture or focal lesion. Sinuses/Orbits: No acute finding. Other: None. IMPRESSION: Mildly prominent lateral ventricles. No other focal abnormality is noted. Electronically Signed   By: Violeta Grey M.D.   On: 03/03/2024 00:39    Subjective: No significant events overnight as discussed with staff, he denies any complaints today, he did ambulate with physical therapy this morning.  Discharge Exam: Vitals:   03/06/24 0339 03/06/24 0704  BP: 129/61 (!) 147/95   Pulse: 66 67  Resp: 17 16  Temp: 97.8 F (36.6 C) (!) 97.5 F (36.4 C)  SpO2: 93% 96%   Vitals:   03/05/24 2337 03/06/24 0017 03/06/24 0339 03/06/24 0704  BP: (!) 151/89 (!) 163/80 129/61 (!) 147/95  Pulse: (!) 58  66 67  Resp: 14  17 16   Temp: 97.7 F (36.5 C)  97.8 F (36.6 C) (!) 97.5 F (36.4 C)  TempSrc: Oral  Oral Oral  SpO2: 93%  93% 96%  Weight:      Height:        General: Pt is alert, awake, not in acute distress Cardiovascular: RRR, S1/S2 +, no rubs, no gallops Respiratory: CTA bilaterally, no wheezing, no rhonchi Abdominal: Soft, NT, ND, bowel sounds + Extremities: no edema, no cyanosis    The results of significant diagnostics from this hospitalization (including imaging, microbiology, ancillary and laboratory) are listed below for reference.     Microbiology: Recent Results (from the past 240 hours)  Resp panel by RT-PCR (RSV, Flu A&B, Covid) Anterior Nasal Swab     Status: None   Collection Time: 03/03/24  5:53 PM   Specimen: Anterior Nasal Swab  Result Value Ref Range Status   SARS Coronavirus 2 by RT PCR NEGATIVE NEGATIVE Final   Influenza A by PCR NEGATIVE NEGATIVE Final   Influenza B by PCR NEGATIVE NEGATIVE Final    Comment: (NOTE) The Xpert Xpress SARS-CoV-2/FLU/RSV plus assay is intended as an aid in the diagnosis of influenza from Nasopharyngeal swab specimens and should not be used as a sole basis for treatment. Nasal washings and aspirates are unacceptable for Xpert Xpress SARS-CoV-2/FLU/RSV testing.  Fact Sheet for Patients: BloggerCourse.com  Fact Sheet for Healthcare Providers: SeriousBroker.it  This test is not yet approved or cleared by the [place]  FDA and has been authorized for detection and/or diagnosis of SARS-CoV-2 by FDA under an Emergency Use Authorization (EUA). This EUA will remain in effect (meaning this test can be used) for the duration of the COVID-19  declaration under Section 564(b)(1) of the Act, 21 U.S.C. section 360bbb-3(b)(1), unless the authorization is terminated or revoked.     Resp Syncytial Virus by PCR NEGATIVE NEGATIVE Final    Comment: (NOTE) Fact Sheet for Patients: BloggerCourse.com  Fact Sheet for Healthcare Providers: SeriousBroker.it  This test is not yet approved or cleared by the [place]  FDA and has been authorized for detection and/or diagnosis of SARS-CoV-2 by FDA under an Emergency Use Authorization (EUA). This EUA will remain in effect (meaning this test can be used) for the duration of the COVID-19 declaration under Section 564(b)(1) of  the Act, 21 U.S.C. section 360bbb-3(b)(1), unless the authorization is terminated or revoked.  Performed at [facility], [address], [place], [place] [address]   Urine Culture (for pregnant, neutropenic or urologic patients or patients with an indwelling urinary catheter)     Status: None   Collection Time: 03/03/24  6:25 PM   Specimen: Urine, Clean Catch  Result Value Ref Range Status   Specimen Description URINE, CLEAN CATCH  Final   Special Requests NONE  Final   Culture   Final    NO GROWTH Performed at [facility], [address], [place], [place] [address]    Report Status 03/05/2024 FINAL  Final  MRSA Next Gen by PCR, Nasal     Status: None   Collection Time: 03/04/24  5:33 AM   Specimen: Nasal Mucosa; Nasal Swab  Result Value Ref Range Status   MRSA by PCR Next Gen NOT DETECTED NOT DETECTED Final    Comment: (NOTE) The GeneXpert MRSA Assay (FDA approved for NASAL specimens only), is one component of a comprehensive MRSA colonization surveillance program. It is not intended to diagnose MRSA infection nor to guide or monitor treatment for MRSA infections. Test performance is not FDA approved in patients less than 45 years old. Performed at [facility], [address]  [place], [place] [address]      Labs: BNP (last 3 results) Recent Labs    03/04/24 0554 03/05/24 0525 03/06/24 0544  BNP 160.3* 123.3* 89.1   Basic Metabolic Panel: Recent Labs  Lab 03/03/24 0005 03/04/24 0554 03/05/24 0525 03/06/24 0544  NA 133* 131* 132* 137  K 4.2 4.0 3.5 3.3*  CL 95* 97* 99 104  CO2 25 25 23 24   GLUCOSE 304* 340* 239* 213*  BUN 14 11 15 14   CREATININE 1.18 1.10 1.13 1.03  CALCIUM 10.2 9.1 8.5* 9.6  MG  --  1.6* 2.1 1.9   Liver Function Tests: Recent Labs  Lab 03/03/24 0005  AST 19  ALT 12  ALKPHOS 106  BILITOT 0.8  PROT 7.8  ALBUMIN 4.3   No results for input(s): LIPASE, AMYLASE in the last 168 hours. Recent Labs  Lab 03/03/24 0430  AMMONIA 18   CBC: Recent Labs  Lab 03/03/24 0005 03/04/24 0554 03/05/24 0525 03/06/24 0544  WBC 9.5 7.2 6.4 7.2  NEUTROABS  --  4.8 3.9 4.6  HGB 15.8 15.0 13.9 14.8  HCT 44.8 42.4 39.6 42.2  MCV 90.3 90.4 90.4 89.6  PLT 386 342 323 420*   Cardiac Enzymes: No results for input(s): CKTOTAL, CKMB, CKMBINDEX, TROPONINI in the last 168 hours. BNP: Invalid input(s): POCBNP CBG: Recent Labs  Lab 03/05/24 0737 03/05/24 1127 03/05/24 1523 03/05/24 2117 03/06/24 0727  GLUCAP 346* 368* 195* 220* 215*   D-Dimer No results for input(s): DDIMER in the last 72 hours. Hgb A1c Recent Labs    03/04/24 0554  HGBA1C 11.8*   Lipid Profile No results for input(s): CHOL, HDL, LDLCALC, TRIG, CHOLHDL, LDLDIRECT in the last 72 hours. Thyroid  function studies Recent Labs    03/04/24 0554  TSH 8.600*   Anemia work up Recent Labs    03/04/24 0554  VITAMINB12 240   Urinalysis    Component Value Date/Time   COLORURINE YELLOW 03/03/2024 0241   APPEARANCEUR CLEAR 03/03/2024 0241   LABSPEC 1.015 03/03/2024 0241   PHURINE 5.5 03/03/2024 0241   GLUCOSEU >=500 (A) 03/03/2024 0241   HGBUR MODERATE (A) 03/03/2024 0241  BILIRUBINUR NEGATIVE 03/03/2024 0241   KETONESUR NEGATIVE  03/03/2024 0241   PROTEINUR 30 (A) 03/03/2024 0241   UROBILINOGEN 1.0 12/19/2008 1130   NITRITE NEGATIVE 03/03/2024 0241   LEUKOCYTESUR TRACE (A) 03/03/2024 0241   Sepsis Labs Recent Labs  Lab 03/03/24 0005 03/04/24 0554 03/05/24 0525 03/06/24 0544  WBC 9.5 7.2 6.4 7.2   Microbiology Recent Results (from the past 240 hours)  Resp panel by RT-PCR (RSV, Flu A&B, Covid) Anterior Nasal Swab     Status: None   Collection Time: 03/03/24  5:53 PM   Specimen: Anterior Nasal Swab  Result Value Ref Range Status   SARS Coronavirus 2 by RT PCR NEGATIVE NEGATIVE Final   Influenza A by PCR NEGATIVE NEGATIVE Final   Influenza B by PCR NEGATIVE NEGATIVE Final    Comment: (NOTE) The Xpert Xpress SARS-CoV-2/FLU/RSV plus assay is intended as an aid in the diagnosis of influenza from Nasopharyngeal swab specimens and should not be used as a sole basis for treatment. Nasal washings and aspirates are unacceptable for Xpert Xpress SARS-CoV-2/FLU/RSV testing.  Fact Sheet for Patients: BloggerCourse.com  Fact Sheet for Healthcare Providers: SeriousBroker.it  This test is not yet approved or cleared by the [place]  FDA and has been authorized for detection and/or diagnosis of SARS-CoV-2 by FDA under an Emergency Use Authorization (EUA). This EUA will remain in effect (meaning this test can be used) for the duration of the COVID-19 declaration under Section 564(b)(1) of the Act, 21 U.S.C. section 360bbb-3(b)(1), unless the authorization is terminated or revoked.     Resp Syncytial Virus by PCR NEGATIVE NEGATIVE Final    Comment: (NOTE) Fact Sheet for Patients: BloggerCourse.com  Fact Sheet for Healthcare Providers: SeriousBroker.it  This test is not yet approved or cleared by the [place]  FDA and has been authorized for detection and/or diagnosis of SARS-CoV-2 by FDA under an  Emergency Use Authorization (EUA). This EUA will remain in effect (meaning this test can be used) for the duration of the COVID-19 declaration under Section 564(b)(1) of the Act, 21 U.S.C. section 360bbb-3(b)(1), unless the authorization is terminated or revoked.  Performed at [facility], [address] [place], [place] [address]   Urine Culture (for pregnant, neutropenic or urologic patients or patients with an indwelling urinary catheter)     Status: None   Collection Time: 03/03/24  6:25 PM   Specimen: Urine, Clean Catch  Result Value Ref Range Status   Specimen Description URINE, CLEAN CATCH  Final   Special Requests NONE  Final   Culture   Final    NO GROWTH Performed at [facility], [address], [place], [place] [address]    Report Status 03/05/2024 FINAL  Final  MRSA Next Gen by PCR, Nasal     Status: None   Collection Time: 03/04/24  5:33 AM   Specimen: Nasal Mucosa; Nasal Swab  Result Value Ref Range Status   MRSA by PCR Next Gen NOT DETECTED NOT DETECTED Final    Comment: (NOTE) The GeneXpert MRSA Assay (FDA approved for NASAL specimens only), is one component of a comprehensive MRSA colonization surveillance program. It is not intended to diagnose MRSA infection nor to guide or monitor treatment for MRSA infections. Test performance is not FDA approved in patients less than 14 years old. Performed at [facility], [address], [place], [place] [address]      Time coordinating discharge: Over 30 minutes  SIGNED:   Seena Dadds, MD  [facility] 03/06/2024, 9:37 AM Pager   If 7PM-7AM, please contact night-coverage [email or web address]

## 2024-03-06 NOTE — Progress Notes (Signed)
   03/06/24 0845  Mobility  Activity Ambulated with assistance in room  Level of Assistance Contact guard assist, steadying assist  Assistive Device Front wheel walker  Distance Ambulated (ft) 20 ft  Activity Response Tolerated well  Mobility Referral Yes  Mobility visit 1 Mobility  Mobility Specialist Start Time (ACUTE ONLY) 0845  Mobility Specialist Stop Time (ACUTE ONLY) 0900  Mobility Specialist Time Calculation (min) (ACUTE ONLY) 15 min   Mobility Specialist: Progress Note  During Mobility: HR 70 Post-Mobility:    HR 67  Pt agreeable to mobility session - received in bed. Pt was asymptomatic throughout session with no complaints. Patient requested to use sink to brush teeth and wash face, performed ind.  Returned to chair with all needs met - call bell within reach. Chair alarm on. RN present.   Richard Ruiz, BS Mobility Specialist Please contact via SecureChat or  Rehab office at [phone].

## 2024-03-06 NOTE — Progress Notes (Signed)
 Discharge reviewed by d/c nursing team   Rosena Bartle, Cammie Cellar, RN

## 2024-03-06 NOTE — Plan of Care (Signed)
  Problem: Education: Goal: Knowledge of General Education information will improve Description: Including pain rating scale, medication(s)/side effects and non-pharmacologic comfort measures Outcome: Adequate for Discharge   Problem: Health Behavior/Discharge Planning: Goal: Ability to manage health-related needs will improve Outcome: Adequate for Discharge   Problem: Clinical Measurements: Goal: Ability to maintain clinical measurements within normal limits will improve Outcome: Adequate for Discharge Goal: Will remain free from infection Outcome: Adequate for Discharge Goal: Diagnostic test results will improve Outcome: Adequate for Discharge Goal: Respiratory complications will improve Outcome: Adequate for Discharge Goal: Cardiovascular complication will be avoided Outcome: Adequate for Discharge   Problem: Activity: Goal: Risk for activity intolerance will decrease Outcome: Adequate for Discharge   Problem: Nutrition: Goal: Adequate nutrition will be maintained Outcome: Adequate for Discharge   Problem: Coping: Goal: Level of anxiety will decrease Outcome: Adequate for Discharge   Problem: Elimination: Goal: Will not experience complications related to bowel motility Outcome: Adequate for Discharge Goal: Will not experience complications related to urinary retention Outcome: Adequate for Discharge   Problem: Pain Managment: Goal: General experience of comfort will improve and/or be controlled Outcome: Adequate for Discharge   Problem: Safety: Goal: Ability to remain free from injury will improve Outcome: Adequate for Discharge   Problem: Education: Goal: Ability to describe self-care measures that may prevent or decrease complications (Diabetes Survival Skills Education) will improve Outcome: Adequate for Discharge Goal: Individualized Educational Video(s) Outcome: Adequate for Discharge   Problem: Coping: Goal: Ability to adjust to condition or change in  health will improve Outcome: Adequate for Discharge   Problem: Fluid Volume: Goal: Ability to maintain a balanced intake and output will improve Outcome: Adequate for Discharge   Problem: Health Behavior/Discharge Planning: Goal: Ability to identify and utilize available resources and services will improve Outcome: Adequate for Discharge Goal: Ability to manage health-related needs will improve Outcome: Adequate for Discharge   Problem: Metabolic: Goal: Ability to maintain appropriate glucose levels will improve Outcome: Adequate for Discharge   Problem: Nutritional: Goal: Maintenance of adequate nutrition will improve Outcome: Adequate for Discharge Goal: Progress toward achieving an optimal weight will improve Outcome: Adequate for Discharge   Problem: Tissue Perfusion: Goal: Adequacy of tissue perfusion will improve Outcome: Adequate for Discharge

## 2024-03-22 ENCOUNTER — Other Ambulatory Visit: Payer: Self-pay

## 2024-03-22 ENCOUNTER — Encounter (HOSPITAL_BASED_OUTPATIENT_CLINIC_OR_DEPARTMENT_OTHER): Payer: Self-pay

## 2024-03-22 DIAGNOSIS — Z7984 Long term (current) use of oral hypoglycemic drugs: Secondary | ICD-10-CM

## 2024-03-22 DIAGNOSIS — Z7982 Long term (current) use of aspirin: Secondary | ICD-10-CM

## 2024-03-22 DIAGNOSIS — N3000 Acute cystitis without hematuria: Secondary | ICD-10-CM | POA: Diagnosis not present

## 2024-03-22 DIAGNOSIS — G8929 Other chronic pain: Secondary | ICD-10-CM | POA: Diagnosis present

## 2024-03-22 DIAGNOSIS — Z885 Allergy status to narcotic agent status: Secondary | ICD-10-CM

## 2024-03-22 DIAGNOSIS — Z87440 Personal history of urinary (tract) infections: Secondary | ICD-10-CM

## 2024-03-22 DIAGNOSIS — N3001 Acute cystitis with hematuria: Secondary | ICD-10-CM | POA: Diagnosis present

## 2024-03-22 DIAGNOSIS — Z79899 Other long term (current) drug therapy: Secondary | ICD-10-CM

## 2024-03-22 DIAGNOSIS — K219 Gastro-esophageal reflux disease without esophagitis: Secondary | ICD-10-CM | POA: Diagnosis present

## 2024-03-22 DIAGNOSIS — N10 Acute tubulo-interstitial nephritis: Secondary | ICD-10-CM | POA: Diagnosis present

## 2024-03-22 DIAGNOSIS — E538 Deficiency of other specified B group vitamins: Secondary | ICD-10-CM | POA: Diagnosis present

## 2024-03-22 DIAGNOSIS — Z794 Long term (current) use of insulin: Secondary | ICD-10-CM

## 2024-03-22 DIAGNOSIS — G9341 Metabolic encephalopathy: Secondary | ICD-10-CM | POA: Diagnosis not present

## 2024-03-22 DIAGNOSIS — E785 Hyperlipidemia, unspecified: Secondary | ICD-10-CM | POA: Diagnosis present

## 2024-03-22 DIAGNOSIS — Z7985 Long-term (current) use of injectable non-insulin antidiabetic drugs: Secondary | ICD-10-CM

## 2024-03-22 DIAGNOSIS — E039 Hypothyroidism, unspecified: Secondary | ICD-10-CM | POA: Diagnosis present

## 2024-03-22 DIAGNOSIS — N12 Tubulo-interstitial nephritis, not specified as acute or chronic: Secondary | ICD-10-CM | POA: Diagnosis present

## 2024-03-22 DIAGNOSIS — E669 Obesity, unspecified: Secondary | ICD-10-CM | POA: Diagnosis present

## 2024-03-22 DIAGNOSIS — I2510 Atherosclerotic heart disease of native coronary artery without angina pectoris: Secondary | ICD-10-CM | POA: Diagnosis present

## 2024-03-22 DIAGNOSIS — I10 Essential (primary) hypertension: Secondary | ICD-10-CM | POA: Diagnosis present

## 2024-03-22 DIAGNOSIS — R197 Diarrhea, unspecified: Secondary | ICD-10-CM | POA: Diagnosis present

## 2024-03-22 DIAGNOSIS — Z6832 Body mass index (BMI) 32.0-32.9, adult: Secondary | ICD-10-CM

## 2024-03-22 DIAGNOSIS — N400 Enlarged prostate without lower urinary tract symptoms: Secondary | ICD-10-CM | POA: Diagnosis present

## 2024-03-22 DIAGNOSIS — E119 Type 2 diabetes mellitus without complications: Secondary | ICD-10-CM | POA: Diagnosis present

## 2024-03-22 DIAGNOSIS — Z79890 Hormone replacement therapy (postmenopausal): Secondary | ICD-10-CM

## 2024-03-22 DIAGNOSIS — Z87891 Personal history of nicotine dependence: Secondary | ICD-10-CM

## 2024-03-22 DIAGNOSIS — G4733 Obstructive sleep apnea (adult) (pediatric): Secondary | ICD-10-CM | POA: Diagnosis present

## 2024-03-22 DIAGNOSIS — Z888 Allergy status to other drugs, medicaments and biological substances status: Secondary | ICD-10-CM

## 2024-03-22 DIAGNOSIS — Z955 Presence of coronary angioplasty implant and graft: Secondary | ICD-10-CM

## 2024-03-22 DIAGNOSIS — F32A Depression, unspecified: Secondary | ICD-10-CM | POA: Diagnosis present

## 2024-03-22 LAB — COMPREHENSIVE METABOLIC PANEL WITH GFR
ALT: 24 U/L (ref 0–44)
AST: 33 U/L (ref 15–41)
Albumin: 4.2 g/dL (ref 3.5–5.0)
Alkaline Phosphatase: 95 U/L (ref 38–126)
Anion gap: 15 (ref 5–15)
BUN: 15 mg/dL (ref 8–23)
CO2: 24 mmol/L (ref 22–32)
Calcium: 9.7 mg/dL (ref 8.9–10.3)
Chloride: 95 mmol/L — ABNORMAL LOW (ref 98–111)
Creatinine, Ser: 1.32 mg/dL — ABNORMAL HIGH (ref 0.61–1.24)
GFR, Estimated: 56 mL/min — ABNORMAL LOW (ref >60)
Glucose, Bld: 213 mg/dL — ABNORMAL HIGH (ref 70–99)
Potassium: 4.2 mmol/L (ref 3.5–5.1)
Sodium: 134 mmol/L — ABNORMAL LOW (ref 135–145)
Total Bilirubin: 1.1 mg/dL (ref 0.0–1.2)
Total Protein: 7.5 g/dL (ref 6.5–8.1)

## 2024-03-22 LAB — TROPONIN T, HIGH SENSITIVITY: Troponin T High Sensitivity: <15 ng/L (ref <19)

## 2024-03-22 LAB — CBC
HCT: 45.3 % (ref 39.0–52.0)
Hemoglobin: 15.7 g/dL (ref 13.0–17.0)
MCH: 31.3 pg (ref 26.0–34.0)
MCHC: 34.7 g/dL (ref 30.0–36.0)
MCV: 90.4 fL (ref 80.0–100.0)
Platelets: 375 10*3/uL (ref 150–400)
RBC: 5.01 MIL/uL (ref 4.22–5.81)
RDW: 12.9 % (ref 11.5–15.5)
WBC: 15.6 10*3/uL — ABNORMAL HIGH (ref 4.0–10.5)
nRBC: 0 % (ref 0.0–0.2)

## 2024-03-22 LAB — CK: Total CK: 325 U/L (ref 49–397)

## 2024-03-22 LAB — PRO BRAIN NATRIURETIC PEPTIDE: Pro Brain Natriuretic Peptide: 229 pg/mL (ref <300.0)

## 2024-03-22 NOTE — ED Triage Notes (Signed)
 Patient/spouse reports patient sat in vehicle with door open under car port for 2-3 hrs today before getting out of vehicle.

## 2024-03-22 NOTE — ED Triage Notes (Signed)
 Pt's spouse reports disorientation beginning this afternoon which typically happen when patient has a UTI per spouse. Increased generalized weakness starting yesterday. Spouse reports patient sees physical therapy for weakness. Weak after mowing today. Pt A&Ox4 in triage. NAD noted. Seen a few weeks ago for same thing and was diagnosed with a UTI.

## 2024-03-23 ENCOUNTER — Emergency Department (HOSPITAL_BASED_OUTPATIENT_CLINIC_OR_DEPARTMENT_OTHER)

## 2024-03-23 ENCOUNTER — Inpatient Hospital Stay (HOSPITAL_BASED_OUTPATIENT_CLINIC_OR_DEPARTMENT_OTHER)
Admission: EM | Admit: 2024-03-23 | Discharge: 2024-03-25 | DRG: 689 | Disposition: A | Discharge status: Home / Self Care | Attending: Family Medicine | Admitting: Family Medicine

## 2024-03-23 ENCOUNTER — Inpatient Hospital Stay (HOSPITAL_COMMUNITY)

## 2024-03-23 DIAGNOSIS — Z79890 Hormone replacement therapy (postmenopausal): Secondary | ICD-10-CM | POA: Diagnosis not present

## 2024-03-23 DIAGNOSIS — E538 Deficiency of other specified B group vitamins: Secondary | ICD-10-CM | POA: Diagnosis present

## 2024-03-23 DIAGNOSIS — G9341 Metabolic encephalopathy: Secondary | ICD-10-CM | POA: Diagnosis present

## 2024-03-23 DIAGNOSIS — E785 Hyperlipidemia, unspecified: Secondary | ICD-10-CM | POA: Diagnosis present

## 2024-03-23 DIAGNOSIS — Z794 Long term (current) use of insulin: Secondary | ICD-10-CM | POA: Diagnosis not present

## 2024-03-23 DIAGNOSIS — Z87891 Personal history of nicotine dependence: Secondary | ICD-10-CM | POA: Diagnosis not present

## 2024-03-23 DIAGNOSIS — G8929 Other chronic pain: Secondary | ICD-10-CM | POA: Diagnosis present

## 2024-03-23 DIAGNOSIS — N3000 Acute cystitis without hematuria: Principal | ICD-10-CM | POA: Diagnosis present

## 2024-03-23 DIAGNOSIS — E669 Obesity, unspecified: Secondary | ICD-10-CM | POA: Diagnosis present

## 2024-03-23 DIAGNOSIS — I2510 Atherosclerotic heart disease of native coronary artery without angina pectoris: Secondary | ICD-10-CM | POA: Diagnosis present

## 2024-03-23 DIAGNOSIS — I10 Essential (primary) hypertension: Secondary | ICD-10-CM | POA: Diagnosis present

## 2024-03-23 DIAGNOSIS — N400 Enlarged prostate without lower urinary tract symptoms: Secondary | ICD-10-CM | POA: Diagnosis present

## 2024-03-23 DIAGNOSIS — E039 Hypothyroidism, unspecified: Secondary | ICD-10-CM | POA: Diagnosis present

## 2024-03-23 DIAGNOSIS — K219 Gastro-esophageal reflux disease without esophagitis: Secondary | ICD-10-CM | POA: Diagnosis present

## 2024-03-23 DIAGNOSIS — Z7984 Long term (current) use of oral hypoglycemic drugs: Secondary | ICD-10-CM | POA: Diagnosis not present

## 2024-03-23 DIAGNOSIS — N3001 Acute cystitis with hematuria: Secondary | ICD-10-CM | POA: Diagnosis present

## 2024-03-23 DIAGNOSIS — F32A Depression, unspecified: Secondary | ICD-10-CM | POA: Diagnosis present

## 2024-03-23 DIAGNOSIS — E119 Type 2 diabetes mellitus without complications: Secondary | ICD-10-CM | POA: Diagnosis present

## 2024-03-23 DIAGNOSIS — Z87440 Personal history of urinary (tract) infections: Secondary | ICD-10-CM | POA: Diagnosis not present

## 2024-03-23 DIAGNOSIS — R269 Unspecified abnormalities of gait and mobility: Secondary | ICD-10-CM

## 2024-03-23 DIAGNOSIS — R197 Diarrhea, unspecified: Secondary | ICD-10-CM | POA: Diagnosis present

## 2024-03-23 DIAGNOSIS — N390 Urinary tract infection, site not specified: Secondary | ICD-10-CM | POA: Diagnosis present

## 2024-03-23 DIAGNOSIS — Z955 Presence of coronary angioplasty implant and graft: Secondary | ICD-10-CM | POA: Diagnosis not present

## 2024-03-23 DIAGNOSIS — N12 Tubulo-interstitial nephritis, not specified as acute or chronic: Secondary | ICD-10-CM | POA: Diagnosis present

## 2024-03-23 LAB — COMPREHENSIVE METABOLIC PANEL WITH GFR
ALT: 18 U/L (ref 0–44)
AST: 22 U/L (ref 15–41)
Albumin: 3 g/dL — ABNORMAL LOW (ref 3.5–5.0)
Alkaline Phosphatase: 63 U/L (ref 38–126)
Anion gap: 12 (ref 5–15)
BUN: 14 mg/dL (ref 8–23)
CO2: 25 mmol/L (ref 22–32)
Calcium: 9.1 mg/dL (ref 8.9–10.3)
Chloride: 98 mmol/L (ref 98–111)
Creatinine, Ser: 1.22 mg/dL (ref 0.61–1.24)
GFR, Estimated: >60 mL/min (ref >60)
Glucose, Bld: 251 mg/dL — ABNORMAL HIGH (ref 70–99)
Potassium: 4.1 mmol/L (ref 3.5–5.1)
Sodium: 135 mmol/L (ref 135–145)
Total Bilirubin: 1.5 mg/dL — ABNORMAL HIGH (ref 0.0–1.2)
Total Protein: 6.3 g/dL — ABNORMAL LOW (ref 6.5–8.1)

## 2024-03-23 LAB — CBC
HCT: 42.8 % (ref 39.0–52.0)
Hemoglobin: 15 g/dL (ref 13.0–17.0)
MCH: 32.7 pg (ref 26.0–34.0)
MCHC: 35 g/dL (ref 30.0–36.0)
MCV: 93.2 fL (ref 80.0–100.0)
Platelets: 282 10*3/uL (ref 150–400)
RBC: 4.59 MIL/uL (ref 4.22–5.81)
RDW: 13.2 % (ref 11.5–15.5)
WBC: 10.3 10*3/uL (ref 4.0–10.5)
nRBC: 0 % (ref 0.0–0.2)

## 2024-03-23 LAB — I-STAT VENOUS BLOOD GAS, ED
Acid-Base Excess: 0 mmol/L (ref 0.0–2.0)
Bicarbonate: 26.4 mmol/L (ref 20.0–28.0)
Calcium, Ion: 1.16 mmol/L (ref 1.15–1.40)
HCT: 47 % (ref 39.0–52.0)
Hemoglobin: 16 g/dL (ref 13.0–17.0)
O2 Saturation: 45 %
Patient temperature: 98
Potassium: 4.1 mmol/L (ref 3.5–5.1)
Sodium: 133 mmol/L — ABNORMAL LOW (ref 135–145)
TCO2: 28 mmol/L (ref 22–32)
pCO2, Ven: 45.7 mmHg (ref 44–60)
pH, Ven: 7.367 (ref 7.25–7.43)
pO2, Ven: 26 mmHg — CL (ref 32–45)

## 2024-03-23 LAB — TROPONIN T, HIGH SENSITIVITY: Troponin T High Sensitivity: 15 ng/L (ref <19)

## 2024-03-23 LAB — LACTIC ACID, PLASMA
Lactic Acid, Venous: 2.7 mmol/L (ref 0.5–1.9)
Lactic Acid, Venous: 2.2 mmol/L (ref 0.5–1.9)
Lactic Acid, Venous: 1.7 mmol/L (ref 0.5–1.9)
Lactic Acid, Venous: 2.1 mmol/L (ref 0.5–1.9)
Lactic Acid, Venous: 1.6 mmol/L (ref 0.5–1.9)

## 2024-03-23 LAB — CBC WITH DIFFERENTIAL/PLATELET
Abs Immature Granulocytes: 0.02 10*3/uL (ref 0.00–0.07)
Basophils Absolute: 0.1 10*3/uL (ref 0.0–0.1)
Basophils Relative: 1 %
Eosinophils Absolute: 0.1 10*3/uL (ref 0.0–0.5)
Eosinophils Relative: 1 %
HCT: 39.9 % (ref 39.0–52.0)
Hemoglobin: 13.9 g/dL (ref 13.0–17.0)
Immature Granulocytes: 0 %
Lymphocytes Relative: 20 %
Lymphs Abs: 1.9 10*3/uL (ref 0.7–4.0)
MCH: 32 pg (ref 26.0–34.0)
MCHC: 34.8 g/dL (ref 30.0–36.0)
MCV: 91.9 fL (ref 80.0–100.0)
Monocytes Absolute: 0.7 10*3/uL (ref 0.1–1.0)
Monocytes Relative: 8 %
Neutro Abs: 6.7 10*3/uL (ref 1.7–7.7)
Neutrophils Relative %: 70 %
Platelets: 279 10*3/uL (ref 150–400)
RBC: 4.34 MIL/uL (ref 4.22–5.81)
RDW: 13.1 % (ref 11.5–15.5)
WBC: 9.4 10*3/uL (ref 4.0–10.5)
nRBC: 0 % (ref 0.0–0.2)

## 2024-03-23 LAB — URINALYSIS, ROUTINE W REFLEX MICROSCOPIC
Bilirubin Urine: NEGATIVE
Glucose, UA: 250 mg/dL — AB
Ketones, ur: NEGATIVE mg/dL
Nitrite: NEGATIVE
Protein, ur: 100 mg/dL — AB
Specific Gravity, Urine: 1.02 (ref 1.005–1.030)
pH: 5.5 (ref 5.0–8.0)

## 2024-03-23 LAB — CREATININE, SERUM
Creatinine, Ser: 1.22 mg/dL (ref 0.61–1.24)
GFR, Estimated: >60 mL/min (ref >60)

## 2024-03-23 LAB — URINALYSIS, MICROSCOPIC (REFLEX): WBC, UA: >50 WBC/hpf (ref 0–5)

## 2024-03-23 LAB — CBG MONITORING, ED: Glucose-Capillary: 284 mg/dL — ABNORMAL HIGH (ref 70–99)

## 2024-03-23 LAB — GLUCOSE, CAPILLARY: Glucose-Capillary: 207 mg/dL — ABNORMAL HIGH (ref 70–99)

## 2024-03-23 MED ORDER — SODIUM CHLORIDE 0.9 % IV SOLN
1.0000 g | INTRAVENOUS | Status: DC
  Administered 2024-03-23 – 2024-03-24 (×2): 1 g via INTRAVENOUS
  Filled 2024-03-23 (×2): qty 10

## 2024-03-23 MED ORDER — METOPROLOL SUCCINATE ER 50 MG PO TB24
50.0000 mg | ORAL_TABLET | Freq: Every day | ORAL | Status: DC
  Administered 2024-03-23 – 2024-03-25 (×3): 50 mg via ORAL
  Filled 2024-03-23 (×3): qty 1

## 2024-03-23 MED ORDER — PANTOPRAZOLE SODIUM 40 MG PO TBEC
40.0000 mg | DELAYED_RELEASE_TABLET | Freq: Every day | ORAL | Status: DC
  Administered 2024-03-24 – 2024-03-25 (×2): 40 mg via ORAL
  Filled 2024-03-23 (×2): qty 1

## 2024-03-23 MED ORDER — INSULIN GLARGINE-YFGN 100 UNIT/ML ~~LOC~~ SOLN
15.0000 [IU] | Freq: Every day | SUBCUTANEOUS | Status: DC
  Administered 2024-03-23: 15 [IU] via SUBCUTANEOUS
  Filled 2024-03-23 (×2): qty 0.15

## 2024-03-23 MED ORDER — IOHEXOL 350 MG/ML SOLN
75.0000 mL | Freq: Once | INTRAVENOUS | Status: AC | PRN
  Administered 2024-03-23: 75 mL via INTRAVENOUS

## 2024-03-23 MED ORDER — POLYETHYLENE GLYCOL 3350 17 G PO PACK: 17.0000 g | PACK | Freq: Every day | ORAL | Status: DC | PRN

## 2024-03-23 MED ORDER — INSULIN ASPART 100 UNIT/ML IJ SOLN
0.0000 [IU] | Freq: Three times a day (TID) | INTRAMUSCULAR | Status: DC
  Administered 2024-03-24: 5 [IU] via SUBCUTANEOUS
  Administered 2024-03-24: 8 [IU] via SUBCUTANEOUS
  Administered 2024-03-24 – 2024-03-25 (×2): 3 [IU] via SUBCUTANEOUS

## 2024-03-23 MED ORDER — ATORVASTATIN CALCIUM 80 MG PO TABS
80.0000 mg | ORAL_TABLET | Freq: Every day | ORAL | Status: DC
  Administered 2024-03-23 – 2024-03-24 (×2): 80 mg via ORAL
  Filled 2024-03-23 (×2): qty 1

## 2024-03-23 MED ORDER — INSULIN ASPART 100 UNIT/ML IJ SOLN
0.0000 [IU] | Freq: Every day | INTRAMUSCULAR | Status: DC
  Administered 2024-03-23: 2 [IU] via SUBCUTANEOUS

## 2024-03-23 MED ORDER — SODIUM CHLORIDE 0.9 % IV SOLN: 1.0000 g | INTRAVENOUS | Status: DC

## 2024-03-23 MED ORDER — ENOXAPARIN SODIUM 40 MG/0.4ML IJ SOSY
40.0000 mg | PREFILLED_SYRINGE | INTRAMUSCULAR | Status: DC
  Administered 2024-03-23 – 2024-03-24 (×2): 40 mg via SUBCUTANEOUS
  Filled 2024-03-23 (×2): qty 0.4

## 2024-03-23 MED ORDER — LEVOTHYROXINE SODIUM 100 MCG PO TABS
100.0000 ug | ORAL_TABLET | Freq: Every day | ORAL | Status: DC
  Administered 2024-03-24: 100 ug via ORAL
  Filled 2024-03-23: qty 1

## 2024-03-23 MED ORDER — FLUCONAZOLE 150 MG PO TABS
150.0000 mg | ORAL_TABLET | Freq: Once | ORAL | Status: AC
  Administered 2024-03-23: 150 mg via ORAL
  Filled 2024-03-23: qty 1

## 2024-03-23 MED ORDER — SODIUM CHLORIDE 0.9 % IV SOLN
1.0000 g | Freq: Once | INTRAVENOUS | Status: AC
  Administered 2024-03-23: 1 g via INTRAVENOUS
  Filled 2024-03-23: qty 10

## 2024-03-23 MED ORDER — DULOXETINE HCL 60 MG PO CPEP
90.0000 mg | ORAL_CAPSULE | Freq: Every day | ORAL | Status: DC
  Administered 2024-03-24 – 2024-03-25 (×2): 90 mg via ORAL
  Filled 2024-03-23 (×2): qty 1

## 2024-03-23 MED ORDER — TAMSULOSIN HCL 0.4 MG PO CAPS
0.4000 mg | ORAL_CAPSULE | Freq: Every day | ORAL | Status: DC
  Administered 2024-03-23 – 2024-03-24 (×2): 0.4 mg via ORAL
  Filled 2024-03-23 (×2): qty 1

## 2024-03-23 MED ORDER — ASPIRIN 81 MG PO TBEC
81.0000 mg | DELAYED_RELEASE_TABLET | Freq: Every day | ORAL | Status: DC
  Administered 2024-03-24 – 2024-03-25 (×2): 81 mg via ORAL
  Filled 2024-03-23 (×2): qty 1

## 2024-03-23 MED ORDER — GABAPENTIN 300 MG PO CAPS
300.0000 mg | ORAL_CAPSULE | Freq: Three times a day (TID) | ORAL | Status: DC
  Administered 2024-03-24 – 2024-03-25 (×4): 300 mg via ORAL
  Filled 2024-03-23 (×4): qty 1

## 2024-03-23 MED ORDER — LACTATED RINGERS IV BOLUS
1000.0000 mL | Freq: Once | INTRAVENOUS | Status: AC
  Administered 2024-03-23: 1000 mL via INTRAVENOUS

## 2024-03-23 NOTE — ED Notes (Signed)
 Checked CBG 284, Dr Roselyn informed

## 2024-03-23 NOTE — Plan of Care (Signed)
   Problem: Education: Goal: Knowledge of General Education information will improve Description Including pain rating scale, medication(s)/side effects and non-pharmacologic comfort measures Outcome: Progressing

## 2024-03-23 NOTE — Progress Notes (Signed)
 Pt transported down to CT.

## 2024-03-23 NOTE — ED Notes (Signed)
 ED Provider at bedside.

## 2024-03-23 NOTE — H&P (Signed)
 History and Physical    TREGAN READ FMW:[id number] DOB: 01/19/1946 DOA: 03/23/2024  PCP: [facility]  Patient coming from: home  I have personally briefly reviewed patient's old medical records in [facility]  Chief Complaint: gait abnormality, confusion  HPI: DENNYS GUIN is Jeronda Don 78 y.o. male who is [employer] [name] Veteran with hx Agent Orange exposure with medical history significant of coronary artery disease, diabetes, hypothyroidism, depression, and multiple other medical issues here after developing gait abnormalities, feeling of being off balance and confused.  Symptoms are similar to prior UTI's he's had.  History obtained with assistance from family given confusion.    He's had issues with UTI's since March.  Denies dysuria, but notes difficulty urinating.  Also notes gait issues, feels off balance, confused, disoriented, forgetful when this happens.  This current episode his symptoms started on Friday.  He denies fevers, chills, cough, chest pain, SOB, abdominal pain.  He notes chronic incontinence of urine (several months at least) and stool (loose stools).  Denies saddle anesthesia.  Has had gait issues that predate the issues with the UTI's.  His family notes Kisa Fujii shuffling gait, he's been struggling to pick up his legs the past year.  Denies smoking.  Denies etoh..    ED Course: labs, imaging, abx.    Review of Systems: As per HPI otherwise all other systems reviewed and are negative.  Past Medical History:  Diagnosis Date   CAD (coronary artery disease)    Carpal tunnel syndrome    bilateral   Cervical spondylosis without myelopathy    Depression    Diabetes mellitus without complication (HCC)    Dyslipidemia    GERD (gastroesophageal reflux disease)    Headache(784.0) 07/15/2013   Hypothyroid    Impotence    Obesity    OSA (obstructive sleep apnea)    on CPAP    Past Surgical History:  Procedure Laterality Date   CATARACT EXTRACTION Left     CORONARY STENT PLACEMENT     INGUINAL HERNIA REPAIR Right    LIPOMA RESECTION Left    neck   PENILE PROSTHESIS IMPLANT      Social History  reports that he quit smoking about 55 years ago. His smoking use included cigarettes. He has never used smokeless tobacco. He reports that he does not drink alcohol and does not use drugs.  Allergies  Allergen Reactions   Codeine Itching   Gabapentin  Other (See Comments)    Jittery   Lisinopril Cough    History reviewed. No pertinent family history.   Prior to Admission medications   Medication Sig Start Date End Date Taking? Authorizing Provider  aspirin  EC 81 MG tablet Take 81 mg by mouth in the morning.    [provider]  atorvastatin  (LIPITOR) 80 MG tablet Take 80 mg by mouth at bedtime.    [provider]  cetirizine (ZYRTEC) 10 MG tablet Take 10 mg by mouth daily.    [provider]  Continuous Glucose Receiver (FREESTYLE LIBRE 3 READER) DEVI Use as directed 03/06/24   Elgergawy, Brayton RAMAN, MD  Continuous Glucose Sensor (FREESTYLE LIBRE 3 PLUS SENSOR) MISC Change sensor every 15 days. 03/06/24   Elgergawy, Brayton RAMAN, MD  cyanocobalamin  (VITAMIN B12) 1000 MCG tablet Take 1 tablet (1,000 mcg total) by mouth daily. 03/06/24   Elgergawy, Brayton RAMAN, MD  DULoxetine  (CYMBALTA ) 30 MG capsule Take 90 mg by mouth in the morning.    [provider]  fluticasone (  FLONASE) 50 MCG/ACT nasal spray Place 1 spray into both nostrils daily as needed (nasal congestion.). 02/10/13   [provider]  gabapentin  (NEURONTIN ) 300 MG capsule Take 300 mg by mouth 3 (three) times daily.    [provider]  insulin  glargine-yfgn (SEMGLEE ) 100 UNIT/ML injection Inject 0.3 mLs (30 Units total) into the skin at bedtime. 03/06/24   Elgergawy, Brayton RAMAN, MD  isosorbide  mononitrate (IMDUR ) 60 MG 24 hr tablet Take 60 mg by mouth in the morning.    [provider]  levothyroxine  (SYNTHROID ) 100 MCG tablet Take 100 mcg by  mouth daily before breakfast.    [provider]  losartan  (COZAAR ) 100 MG tablet Take 100 mg by mouth in the morning. 12/06/13   [provider]  metFORMIN (GLUCOPHAGE) 1000 MG tablet Take 1,000 mg by mouth 2 (two) times daily. 02/13/13   [provider]  metoprolol  succinate (TOPROL -XL) 100 MG 24 hr tablet Take 1 tablet (100 mg total) by mouth in the morning. 03/06/24   Elgergawy, Dawood S, MD  Multiple Vitamin (MULTIVITAMIN) tablet Take 1 tablet by mouth in the morning.    [provider]  nitroGLYCERIN  (NITROSTAT ) 0.4 MG SL tablet Place 0.4 mg under the tongue every 5 (five) minutes x 3 doses as needed for chest pain.    [provider]  omeprazole (PRILOSEC) 40 MG capsule Take 40 mg by mouth daily before breakfast.    [provider]  pioglitazone  (ACTOS ) 30 MG tablet Take 30 mg by mouth in the morning.    [provider]  SEMAGLUTIDE, 1 MG/DOSE, [place] Inject 1 mg into the skin every Wednesday.    [provider]  vitamin D3 (CHOLECALCIFEROL) 25 MCG tablet Take 2,000 Units by mouth at bedtime.    [provider]    Physical Exam: Vitals:   03/23/24 1200 03/23/24 1400 03/23/24 1500 03/23/24 1659  BP: 136/85 138/87 121/80 (!) 141/90  Pulse: 71 81 82 85  Resp: 18  18 18   Temp:   98.1 F (36.7 C) (!) 97.5 F (36.4 C)  TempSrc:   Oral   SpO2: 100% 100% 98% 96%  Weight:      Height:        Constitutional: NAD, calm, comfortable Vitals:   03/23/24 1200 03/23/24 1400 03/23/24 1500 03/23/24 1659  BP: 136/85 138/87 121/80 (!) 141/90  Pulse: 71 81 82 85  Resp: 18  18 18   Temp:   98.1 F (36.7 C) (!) 97.5 F (36.4 C)  TempSrc:   Oral   SpO2: 100% 100% 98% 96%  Weight:      Height:       Eyes: PERRL, lids and conjunctivae normal ENMT: Mucous membranes are moist. Neck: normal, supple Respiratory: clear to auscultation bilaterally, no wheezing, no crackles. Cardiovascular: Regular rate and rhythm, no murmurs  / rubs / gallops. No extremity edema. Abdomen: no tenderness, no masses palpated.  Musculoskeletal: no clubbing / cyanosis. No joint deformity upper and lower extremities. Good ROM, no contractures. Normal muscle tone.  Skin: no rashes, lesions, ulcers. No induration Neurologic: CN 2-12 intact. 5/5 strength upper and lower.  FNF and H2S intact.  No saddle anesthesia.  Psychiatric: Normal judgment and insight. Alert and oriented x 3. Normal mood.   Labs on Admission: I have personally reviewed following labs and imaging studies  CBC: Recent Labs  Lab 03/22/24 2205 03/23/24 0140 03/23/24 1753  WBC 15.6*  --  9.4  NEUTROABS  --   --  6.7  HGB 15.7 16.0 13.9  HCT 45.3 47.0 39.9  MCV 90.4  --  91.9  PLT 375  --  279    Basic Metabolic Panel: Recent Labs  Lab 03/22/24 2205 03/23/24 0140 03/23/24 1753  NA 134* 133* 135  K 4.2 4.1 4.1  CL 95*  --  98  CO2 24  --  25  GLUCOSE 213*  --  251*  BUN 15  --  14  CREATININE 1.32*  --  1.22  CALCIUM  9.7  --  9.1    GFR: Estimated Creatinine Clearance: 54.2 mL/min (by C-G formula based on SCr of 1.22 mg/dL).  Liver Function Tests: Recent Labs  Lab 03/22/24 2205 03/23/24 1753  AST 33 22  ALT 24 18  ALKPHOS 95 63  BILITOT 1.1 1.5*  PROT 7.5 6.3*  ALBUMIN 4.2 3.0*    Urine analysis:    Component Value Date/Time   COLORURINE YELLOW 03/22/2024 2201   APPEARANCEUR CLOUDY (Nihal Doan) 03/22/2024 2201   LABSPEC 1.020 03/22/2024 2201   PHURINE 5.5 03/22/2024 2201   GLUCOSEU 250 (Tiwanna Tuch) 03/22/2024 2201   HGBUR LARGE (Jansen Sciuto) 03/22/2024 2201   BILIRUBINUR NEGATIVE 03/22/2024 2201   KETONESUR NEGATIVE 03/22/2024 2201   PROTEINUR 100 (Daimen Shovlin) 03/22/2024 2201   UROBILINOGEN 1.0 12/19/2008 1130   NITRITE NEGATIVE 03/22/2024 2201   LEUKOCYTESUR SMALL (Fern Canova) 03/22/2024 2201    Radiological Exams on Admission: DG Chest Port 1 View Result Date: 03/23/2024 CLINICAL DATA:  Weakness, altered mental status, hypoxia EXAM: PORTABLE CHEST 1 VIEW COMPARISON:   03/03/2024 FINDINGS: Low lung volumes accentuate cardiomediastinal silhouette and pulmonary vascularity. Left basilar atelectasis. The lungs are otherwise clear. No pleural effusion or pneumothorax. IMPRESSION: Low lung volumes with left basilar atelectasis. Electronically Signed   By: Norman Gatlin M.D.   On: 03/23/2024 02:39    EKG: Independently reviewed. Sinus tachy  Assessment/Plan Principal Problem:   Acute cystitis without hematuria Active Problems:   UTI (urinary tract infection)    Assessment and Plan:  Pyuria  Suspected Urinary Tract Infection  Leukocytosis Recurrent for 6 months, sounds like 3 episodes?  This is Geniyah Eischeid new trend for him since March. Unclear what is predisposing him to UTI's?  No definitive UTI with positive culture noted in our records notably.   Reportedly had urine culture with candida glabrata in March? Not sure if I can see these records.  Continue ceftriaxone  Follow urine culture Blood cultures pending Given recurrent episodes, will get CT to look for any anatomic abnormality which could predispose to this  Acute Metabolic Encephalopathy  Gait Abnormality Related to above Still seems mildly confused, but improved.  Not to his baseline.  No focal deficits on exam.   He had head CT on 6/8 with prominent lateral ventricles -> I think NPH should be considered in this gentleman who has had intermittent encephalopathy, gait abnormality (shuffling gait), and incontinence.  Will place outpatient neurology referral.  Consider MRI prior to discharge.   B12, folate, ammonia, TSH PT/OT  Lower Urinary Tract Symptoms Follow CT as above Starting flomax for presumed BPH Follow bladder scans   Incontinence Notes diarrhea as well as incontinence of urine (several months).  Would have low threshold for spine imaging, but no concerning findings on neuro exam, no saddle anesthesia, no back pain.  Will keep on radar - consider imaging if concerning  symptoms.  T2DM Lower basal insulin  SSI Hold metformin, actos , semaglutide  GERD PPI  Hypertension Lower dose metoprolol  Holding losartan , imdur  for now  CAD Aspirin , statin  Chronic Pain Gabapentin  Cymbalta   Hypothyroidism Synthroid      DVT prophylaxis: lovenox   Code Status:   full  Family Communication:  Wife, son  Disposition Plan:   Patient is from:  home  Anticipated DC to:  home  Anticipated DC date:  pending  Anticipated DC barriers: Pending improvemetn  Consults called:  none  Admission status:  inpatient   Severity of Illness: The appropriate patient status for this patient is INPATIENT. Inpatient status is judged to be reasonable and necessary in order to provide the required intensity of service to ensure the patient's safety. The patient's presenting symptoms, physical exam findings, and initial radiographic and laboratory data in the context of their chronic comorbidities is felt to place them at high risk for further clinical deterioration. Furthermore, it is not anticipated that the patient will be medically stable for discharge from the hospital within 2 midnights of admission.   * I certify that at the point of admission it is my clinical judgment that the patient will require inpatient hospital care spanning beyond 2 midnights from the point of admission due to high intensity of service, high risk for further deterioration and high frequency of surveillance required.DEWAINE Meliton Monte MD [facility]  How to contact the [facility] Attending or Consulting provider 7A - 7P or covering provider during after hours 7P -7A, for this patient?   Check the care team in [facility] and look for Peighton Mehra) attending/consulting [facility] provider listed and b) the [facility] team listed Log into [email or web address] and use [facility] universal password to access. If you do not have the password, please contact the hospital operator. Locate the [facility] provider you are looking for under [facility] and page to Caren Garske number that you can be directly reached. If you still have difficulty reaching the provider, please page the [facility] (Director on Call) for the Hospitalists listed on amion for assistance.  03/23/2024, 7:01 PM

## 2024-03-23 NOTE — ED Provider Notes (Signed)
 [facility]  Provider Note  CSN: [id number] Arrival date & time: 03/22/24 2137  History Chief Complaint  Patient presents with   Weakness    Richard Ruiz is a 78 y.o. male brought to the ED by family for evaluation of weakness. He has had several recent episodes of generalized weakness, attributed to UTI. Admitted on 6/9 for similar, improved over several days and eventually discharged home with home PT. Wife reports this morning he did some lawn work but after lunch he was too weak to get out of the car and sat in the vehicle in their carport for about 3 hours. Eventually family had to help him into the house but he was still too weak to stand and ultimately they brought him here for evaluation. He has not had a fever. He has had dysuria. No cough, SOB, CP, N/V/D.    Home Medications Prior to Admission medications   Medication Sig Start Date End Date Taking? Authorizing Provider  aspirin  EC 81 MG tablet Take 81 mg by mouth in the morning.    [provider]  atorvastatin  (LIPITOR) 80 MG tablet Take 80 mg by mouth at bedtime.    [provider]  cetirizine (ZYRTEC) 10 MG tablet Take 10 mg by mouth daily.    [provider]  Continuous Glucose Receiver (FREESTYLE LIBRE 3 READER) DEVI Use as directed 03/06/24   Elgergawy, Brayton RAMAN, MD  Continuous Glucose Sensor (FREESTYLE LIBRE 3 PLUS SENSOR) MISC Change sensor every 15 days. 03/06/24   Elgergawy, Brayton RAMAN, MD  cyanocobalamin  (VITAMIN B12) 1000 MCG tablet Take 1 tablet (1,000 mcg total) by mouth daily. 03/06/24   Elgergawy, Brayton RAMAN, MD  DULoxetine  (CYMBALTA ) 30 MG capsule Take 90 mg by mouth in the morning.    [provider]  fluticasone (FLONASE) 50 MCG/ACT nasal spray Place 1 spray into both nostrils daily as needed (nasal congestion.). 02/10/13   [provider]  gabapentin  (NEURONTIN ) 300 MG capsule Take 300 mg by mouth 3 (three) times daily.     [provider]  insulin  glargine-yfgn (SEMGLEE ) 100 UNIT/ML injection Inject 0.3 mLs (30 Units total) into the skin at bedtime. 03/06/24   Elgergawy, Brayton RAMAN, MD  isosorbide  mononitrate (IMDUR ) 60 MG 24 hr tablet Take 60 mg by mouth in the morning.    [provider]  levothyroxine  (SYNTHROID ) 100 MCG tablet Take 100 mcg by mouth daily before breakfast.    [provider]  losartan  (COZAAR ) 100 MG tablet Take 100 mg by mouth in the morning. 12/06/13   [provider]  metFORMIN (GLUCOPHAGE) 1000 MG tablet Take 1,000 mg by mouth 2 (two) times daily. 02/13/13   [provider]  metoprolol  succinate (TOPROL -XL) 100 MG 24 hr tablet Take 1 tablet (100 mg total) by mouth in the morning. 03/06/24   Elgergawy, Dawood S, MD  Multiple Vitamin (MULTIVITAMIN) tablet Take 1 tablet by mouth in the morning.    [provider]  nitroGLYCERIN  (NITROSTAT ) 0.4 MG SL tablet Place 0.4 mg under the tongue every 5 (five) minutes x 3 doses as needed for chest pain.    [provider]  omeprazole (PRILOSEC) 40 MG capsule Take 40 mg by mouth daily before breakfast.    [provider]  pioglitazone  (ACTOS ) 30 MG tablet Take 30 mg by mouth in the morning.    [provider]  SEMAGLUTIDE, 1 MG/DOSE, [place] Inject 1 mg into the skin every  Wednesday.    [provider]  vitamin D3 (CHOLECALCIFEROL) 25 MCG tablet Take 2,000 Units by mouth at bedtime.    [provider]     Allergies    Codeine, Gabapentin , and Lisinopril   Review of Systems   Review of Systems Please see HPI for pertinent positives and negatives  Physical Exam BP 124/75   Pulse 98   Temp 98.1 F (36.7 C) (Oral)   Resp (!) 21   Ht 5' 6 (1.676 m)   Wt 90.3 kg   SpO2 100%   BMI 32.12 kg/m   Physical Exam Vitals and nursing note reviewed.  Constitutional:      Appearance: Normal appearance.  HENT:     Head: Normocephalic and atraumatic.     Nose: Nose  normal.     Mouth/Throat:     Mouth: Mucous membranes are moist.   Eyes:     Extraocular Movements: Extraocular movements intact.     Conjunctiva/sclera: Conjunctivae normal.    Cardiovascular:     Rate and Rhythm: Normal rate.  Pulmonary:     Effort: Pulmonary effort is normal.     Breath sounds: Normal breath sounds. No wheezing or rales.  Abdominal:     General: Abdomen is flat.     Palpations: Abdomen is soft.     Tenderness: There is no abdominal tenderness.   Musculoskeletal:        General: No swelling. Normal range of motion.     Cervical back: Neck supple.   Skin:    General: Skin is warm and dry.   Neurological:     General: No focal deficit present.     Mental Status: He is alert.     Comments: Globally weak, but no focal deficits  Psychiatric:        Mood and Affect: Mood normal.     ED Results / Procedures / Treatments   EKG EKG Interpretation Date/Time:  Friday March 22 2024 22:11:07 EDT Ventricular Rate:  105 PR Interval:  194 QRS Duration:  110 QT Interval:  361 QTC Calculation: 478 R Axis:   58  Text Interpretation: Sinus tachycardia Abnormal R-wave progression, late transition Borderline T abnormalities, inferior leads Borderline prolonged QT interval rate increased lateral ST deression compared to previous may be rate related. Confirmed by Armenta Canning [id number]) on 03/22/2024 10:15:57 PM  Procedures Procedures  Medications Ordered in the ED Medications  lactated ringers bolus 1,000 mL (1,000 mLs Intravenous New Bag/Given 03/23/24 0223)  cefTRIAXone  (ROCEPHIN ) 1 g in sodium chloride  0.9 % 100 mL IVPB (0 g Intravenous Stopped 03/23/24 0223)  fluconazole  (DIFLUCAN ) tablet 150 mg (150 mg Oral Given 03/23/24 0225)    Initial Impression and Plan  Patient here with generalized weakness and dysuria, similar to admission earlier this month for UTI and metabolic encephalopathy. He was apparently getting more weak in the waiting room. His urine culture  from last ED visit was negative but he apparently had an outpatient urine culture positive for candida glabrata in March of this year treated with Keflex  and Diflucan . Labs done in triage show CBC with leukocytosis, CMP with mild increase in Cr compared to previous. UA consistent with UTI, also has budding yeast concerning for recurrence of fungal infection. Will give rocephin  as well as oral diflucan  for now. SpO2 borderline low, improves with deep breath, but drops to 88% when resting. Will add CXR. Trop and BNP are normal. CK is normal. Glucose is not low.   ED Course  Clinical Course as of 03/23/24 0251  Sat Mar 23, 2024  0212 VBG without acidosis or hypercapnia.  [CS]  0229 Lactic acid is mildly elevated, will recheck after IVF.  [CS]  [phone] Spoke with Dr. KYM Hurst, Hospitalist, who will accept for admission.  [CS]    Clinical Course User Index [CS] Roselyn Carlin NOVAK, MD     MDM Rules/Calculators/A&P Medical Decision Making Problems Addressed: Acute cystitis without hematuria: acute illness or injury Metabolic encephalopathy: acute illness or injury  Amount and/or Complexity of Data Reviewed Labs: ordered. Decision-making details documented in ED Course. Radiology: ordered and independent interpretation performed. Decision-making details documented in ED Course. ECG/medicine tests: ordered and independent interpretation performed. Decision-making details documented in ED Course.  Risk Prescription drug management. Decision regarding hospitalization.     Final Clinical Impression(s) / ED Diagnoses Final diagnoses:  Acute cystitis without hematuria  Metabolic encephalopathy    Rx / DC Orders ED Discharge Orders     None        Roselyn Carlin NOVAK, MD 03/23/24 [phone]

## 2024-03-24 DIAGNOSIS — N3000 Acute cystitis without hematuria: Secondary | ICD-10-CM | POA: Diagnosis not present

## 2024-03-24 LAB — COMPREHENSIVE METABOLIC PANEL WITH GFR
ALT: 18 U/L (ref 0–44)
AST: 22 U/L (ref 15–41)
Albumin: 3 g/dL — ABNORMAL LOW (ref 3.5–5.0)
Alkaline Phosphatase: 57 U/L (ref 38–126)
Anion gap: 9 (ref 5–15)
BUN: 14 mg/dL (ref 8–23)
CO2: 25 mmol/L (ref 22–32)
Calcium: 9 mg/dL (ref 8.9–10.3)
Chloride: 100 mmol/L (ref 98–111)
Creatinine, Ser: 1.18 mg/dL (ref 0.61–1.24)
GFR, Estimated: >60 mL/min (ref >60)
Glucose, Bld: 214 mg/dL — ABNORMAL HIGH (ref 70–99)
Potassium: 3.8 mmol/L (ref 3.5–5.1)
Sodium: 134 mmol/L — ABNORMAL LOW (ref 135–145)
Total Bilirubin: 1.1 mg/dL (ref 0.0–1.2)
Total Protein: 6.5 g/dL (ref 6.5–8.1)

## 2024-03-24 LAB — CBC WITH DIFFERENTIAL/PLATELET
Abs Immature Granulocytes: 0.02 10*3/uL (ref 0.00–0.07)
Basophils Absolute: 0 10*3/uL (ref 0.0–0.1)
Basophils Relative: 1 %
Eosinophils Absolute: 0.1 10*3/uL (ref 0.0–0.5)
Eosinophils Relative: 1 %
HCT: 39.8 % (ref 39.0–52.0)
Hemoglobin: 13.7 g/dL (ref 13.0–17.0)
Immature Granulocytes: 0 %
Lymphocytes Relative: 18 %
Lymphs Abs: 1.6 10*3/uL (ref 0.7–4.0)
MCH: 31.5 pg (ref 26.0–34.0)
MCHC: 34.4 g/dL (ref 30.0–36.0)
MCV: 91.5 fL (ref 80.0–100.0)
Monocytes Absolute: 0.6 10*3/uL (ref 0.1–1.0)
Monocytes Relative: 7 %
Neutro Abs: 6.3 10*3/uL (ref 1.7–7.7)
Neutrophils Relative %: 73 %
Platelets: 276 10*3/uL (ref 150–400)
RBC: 4.35 MIL/uL (ref 4.22–5.81)
RDW: 12.9 % (ref 11.5–15.5)
WBC: 8.6 10*3/uL (ref 4.0–10.5)
nRBC: 0 % (ref 0.0–0.2)

## 2024-03-24 LAB — GLUCOSE, CAPILLARY
Glucose-Capillary: 232 mg/dL — ABNORMAL HIGH (ref 70–99)
Glucose-Capillary: 252 mg/dL — ABNORMAL HIGH (ref 70–99)
Glucose-Capillary: 157 mg/dL — ABNORMAL HIGH (ref 70–99)
Glucose-Capillary: 190 mg/dL — ABNORMAL HIGH (ref 70–99)

## 2024-03-24 LAB — TSH: TSH: 11.436 u[IU]/mL — ABNORMAL HIGH (ref 0.350–4.500)

## 2024-03-24 LAB — FOLATE: Folate: 4.7 ng/mL — ABNORMAL LOW (ref >5.9)

## 2024-03-24 LAB — BASIC METABOLIC PANEL WITH GFR
Anion gap: 9 (ref 5–15)
BUN: 13 mg/dL (ref 8–23)
CO2: 25 mmol/L (ref 22–32)
Calcium: 9.1 mg/dL (ref 8.9–10.3)
Chloride: 99 mmol/L (ref 98–111)
Creatinine, Ser: 1.05 mg/dL (ref 0.61–1.24)
GFR, Estimated: >60 mL/min (ref >60)
Glucose, Bld: 202 mg/dL — ABNORMAL HIGH (ref 70–99)
Potassium: 3.9 mmol/L (ref 3.5–5.1)
Sodium: 133 mmol/L — ABNORMAL LOW (ref 135–145)

## 2024-03-24 LAB — MAGNESIUM: Magnesium: 1.2 mg/dL — ABNORMAL LOW (ref 1.7–2.4)

## 2024-03-24 LAB — URINE CULTURE: Culture: NO GROWTH

## 2024-03-24 LAB — AMMONIA: Ammonia: 14 umol/L (ref 9–35)

## 2024-03-24 LAB — VITAMIN B12: Vitamin B-12: 317 pg/mL (ref 180–914)

## 2024-03-24 LAB — LACTIC ACID, PLASMA: Lactic Acid, Venous: 1.5 mmol/L (ref 0.5–1.9)

## 2024-03-24 LAB — PHOSPHORUS: Phosphorus: 2.2 mg/dL — ABNORMAL LOW (ref 2.5–4.6)

## 2024-03-24 MED ORDER — MAGNESIUM SULFATE 4 GM/100ML IV SOLN
4.0000 g | Freq: Once | INTRAVENOUS | Status: AC
  Administered 2024-03-24: 4 g via INTRAVENOUS
  Filled 2024-03-24: qty 100

## 2024-03-24 MED ORDER — INSULIN GLARGINE-YFGN 100 UNIT/ML ~~LOC~~ SOLN
25.0000 [IU] | Freq: Every day | SUBCUTANEOUS | Status: DC
  Administered 2024-03-24: 25 [IU] via SUBCUTANEOUS
  Filled 2024-03-24 (×2): qty 0.25

## 2024-03-24 MED ORDER — LEVOTHYROXINE SODIUM 25 MCG PO TABS
125.0000 ug | ORAL_TABLET | Freq: Every day | ORAL | Status: DC
  Administered 2024-03-25: 125 ug via ORAL
  Filled 2024-03-24: qty 1

## 2024-03-24 MED ORDER — K PHOS MONO-SOD PHOS DI & MONO 155-852-130 MG PO TABS
500.0000 mg | ORAL_TABLET | Freq: Four times a day (QID) | ORAL | Status: AC
  Administered 2024-03-24 – 2024-03-25 (×4): 500 mg via ORAL
  Filled 2024-03-24 (×4): qty 2

## 2024-03-24 MED ORDER — FOLIC ACID 1 MG PO TABS
1.0000 mg | ORAL_TABLET | Freq: Every day | ORAL | Status: DC
  Administered 2024-03-24 – 2024-03-25 (×2): 1 mg via ORAL
  Filled 2024-03-24 (×2): qty 1

## 2024-03-24 NOTE — Evaluation (Signed)
 Physical Therapy Evaluation Patient Details Name: Richard Ruiz MRN: [id number] DOB: 1946/07/24 Today's Date: 03/24/2024  History of Present Illness  Pt is a 78 y/o male presenting 03/22/24 with AMS and weakness. Found to have acute cystitis,  UTI. PMH: HTN, HLD, DM, neuropathy, agent orange exposure in [name], hypothyroidism, CAD, BPH, fatty liver, OSA, PTSD, migraines, cataract, recurrent UTI's  Clinical Impression  Pt admitted with above diagnosis. Pt confusion improved since yesterday and pt does not remember much of yesterday, however, still with noted STM deficits in regards to safety considerations with mobility. He needed reminders for things that he was noted to remember on last admission 2 weeks ago. Pt with 1 LOB in standing with min A to correct. Ambulated 52' with RW and min A. Had begun working with HHPT and recommend continuance when d/c home.  Pt currently with functional limitations due to the deficits listed below (see PT Problem List). Pt will benefit from acute skilled PT to increase their independence and safety with mobility to allow discharge.           If plan is discharge home, recommend the following: A little help with walking and/or transfers;A little help with bathing/dressing/bathroom;Assistance with cooking/housework;Assist for transportation;Help with stairs or ramp for entrance   Can travel by private vehicle        Equipment Recommendations None recommended by PT  Recommendations for Other Services       Functional Status Assessment Patient has had a recent decline in their functional status and demonstrates the ability to make significant improvements in function in a reasonable and predictable amount of time.     Precautions / Restrictions Precautions Precautions: Fall Recall of Precautions/Restrictions: Intact Precaution/Restrictions Comments: has had many falls, fell right before this admission scraping knee Restrictions Weight Bearing Restrictions  Per Provider Order: No      Mobility  Bed Mobility Overal bed mobility: Needs Assistance Bed Mobility: Supine to Sit     Supine to sit: Used rails, Min assist     General bed mobility comments: pt initiates coming to EOB but needed min HHA to complete task. Had difficulty gaining balance once up and had one LOB posterior requiring min A to return to upright    Transfers Overall transfer level: Needs assistance Equipment used: Rolling walker (2 wheels) Transfers: Sit to/from Stand Sit to Stand: Supervision           General transfer comment: needed reminder of proper hand placement and did not remember to do this with stand>sit but could verbalize afterwards that he should have. Was noted on last admission that he did remember these things without cues.    Ambulation/Gait Ambulation/Gait assistance: Contact guard assist Gait Distance (Feet): 80 Feet Assistive device: Rolling walker (2 wheels) Gait Pattern/deviations: Step-through pattern, Decreased stride length, Decreased dorsiflexion - left, Decreased dorsiflexion - right, Shuffle, Narrow base of support, Trunk flexed Gait velocity: decr Gait velocity interpretation: 1.31 - 2.62 ft/sec, indicative of limited community ambulator   General Gait Details: pt with very low step height and narrow base. Worked on widening and increasing step height and pt familiar with these cues and was able to do so minimally. Moves very slowly and has minimal ability to correct for perturbation  Stairs            Wheelchair Mobility     Tilt Bed    Modified Rankin (Stroke Patients Only)       Balance Overall balance assessment: Needs assistance Sitting-balance support: No  upper extremity supported, Feet supported Sitting balance-Leahy Scale: Fair     Standing balance support: Bilateral upper extremity supported, During functional activity Standing balance-Leahy Scale: Poor Standing balance comment: 1 LOB to R in static  standing                             Pertinent Vitals/Pain Pain Assessment Pain Assessment: No/denies pain    Home Living Family/patient expects to be discharged to:: Private residence Living Arrangements: Spouse/significant other Available Help at Discharge: Family;Available 24 hours/day Type of Home: House Home Access: Stairs to enter Entrance Stairs-Rails: Right;Left;Can reach both Entrance Stairs-Number of Steps: 2   Home Layout: One level Home Equipment: Rollator (4 wheels);Cane - single Information systems manager (2 wheels) Additional Comments: has been receiving HHPT since last admission    Prior Function Prior Level of Function : Independent/Modified Independent;History of Falls (last six months);Needs assist             Mobility Comments: uses cane mostly for mobility, hx of falls due to difficulty lifting feet. uses rollator sometimes out in community (leaving [employer], etc). Was driving before last admission ADLs Comments: Indep with ADLs, shares cleaning with wife Occupational psychologist, etc)     Extremity/Trunk Assessment   Upper Extremity Assessment Upper Extremity Assessment: Overall WFL for tasks assessed    Lower Extremity Assessment Lower Extremity Assessment: RLE deficits/detail RLE Deficits / Details: ROM and strength WFL when tested but with mobility pt with decreased dorsiflexion bilaterally and quick fatigue of LE's. R=L RLE Sensation: history of peripheral neuropathy RLE Coordination: decreased gross motor    Cervical / Trunk Assessment Cervical / Trunk Assessment: Other exceptions Cervical / Trunk Exceptions: forward head, maintains downward gaze with ambualation  Communication   Communication Communication: No apparent difficulties    Cognition Arousal: Alert Behavior During Therapy: WFL for tasks assessed/performed   PT - Cognitive impairments: Memory                       PT - Cognition Comments: AMS yesterday but  has cleared today. Did note some STM deficits with mobility Following commands: Intact Following commands impaired: Follows multi-step commands with increased time     Cueing Cueing Techniques: Verbal cues     General Comments General comments (skin integrity, edema, etc.): VSS on RA.    Exercises     Assessment/Plan    PT Assessment Patient needs continued PT services  PT Problem List Decreased strength;Decreased knowledge of precautions;Decreased safety awareness;Decreased knowledge of use of DME;Decreased mobility;Decreased balance;Decreased activity tolerance;Decreased range of motion;Cardiopulmonary status limiting activity       PT Treatment Interventions DME instruction;Gait training;Stair training;Functional mobility training;Therapeutic activities;Therapeutic exercise;Balance training;Neuromuscular re-education;Patient/family education    PT Goals (Current goals can be found in the Care Plan section)  Acute Rehab PT Goals Patient Stated Goal: stop falling, improve balance PT Goal Formulation: With patient Time For Goal Achievement: 04/07/24 Potential to Achieve Goals: Good    Frequency Min 2X/week     Co-evaluation               AM-PAC PT 6 Clicks Mobility  Outcome Measure Help needed turning from your back to your side while in a flat bed without using bedrails?: A Little Help needed moving from lying on your back to sitting on the side of a flat bed without using bedrails?: A Little Help needed moving to and from a bed to a  chair (including a wheelchair)?: A Little Help needed standing up from a chair using your arms (e.g., wheelchair or bedside chair)?: A Little Help needed to walk in hospital room?: A Little Help needed climbing 3-5 steps with a railing? : A Little 6 Click Score: 18    End of Session Equipment Utilized During Treatment: Gait belt Activity Tolerance: Patient tolerated treatment well Patient left: in chair;with call bell/phone within  reach;with chair alarm set Nurse Communication: Mobility status PT Visit Diagnosis: Muscle weakness (generalized) (M62.81);Difficulty in walking, not elsewhere classified (R26.2);Unsteadiness on feet (R26.81);Other abnormalities of gait and mobility (R26.89)    Time: 8456-8391 PT Time Calculation (min) (ACUTE ONLY): 25 min   Charges:   PT Evaluation $PT Eval Moderate Complexity: 1 Mod PT Treatments $Gait Training: 8-22 mins PT General Charges $$ ACUTE PT VISIT: 1 Visit         Richerd Lipoma, PT  Acute Rehab Services Secure chat preferred Office [phone]   Richerd CROME Talulah Schirmer 03/24/2024, 4:54 PM

## 2024-03-24 NOTE — Plan of Care (Signed)
   Problem: Education: Goal: Knowledge of General Education information will improve Description Including pain rating scale, medication(s)/side effects and non-pharmacologic comfort measures Outcome: Progressing

## 2024-03-24 NOTE — Plan of Care (Signed)

## 2024-03-24 NOTE — Progress Notes (Addendum)
 PROGRESS NOTE    Richard Ruiz  FMW:[id number] DOB: [birth year] DOA: 03/23/2024 [facility]CP: [facility]  Chief Complaint  Patient presents with   Weakness    Brief Narrative:   Richard Ruiz is Richard Ruiz 78 y.o. male who is Richard Ruiz Veteran with hx Agent Orange exposure with medical history significant of coronary artery disease, diabetes, hypothyroidism, depression, and multiple other medical issues here after developing gait abnormalities, feeling of being off balance and confused.  Symptoms are similar to prior UTI's he's had.   Assessment & Plan:   Principal Problem:   Acute cystitis without hematuria Active Problems:   UTI (urinary tract infection)  Pyuria  Suspected Urinary Tract Infection  Leukocytosis Recurrent for 6 months, sounds like 3 episodes?  This is Richard Ruiz new trend for him since March. Unclear what is predisposing him to UTI's?  No definitive UTI with positive culture noted in our records notably.   Reportedly had urine culture with candida glabrata in March? Not sure if I can see these records.  Continue ceftriaxone  UA with few bacteria, budding yeast, 6-10 RBC, >50 WBC, small LE  Urine culture no growth (abx given before culture collected notably) Blood cultures pending CT with bilateral pyelo and cystitis   Acute Metabolic Encephalopathy  Gait Abnormality Related to above Still seems mildly confused, but improved.  Not to his baseline.  No focal deficits on exam.   He had head CT on 6/8 with prominent lateral ventricles -> I think NPH should be considered in this gentleman who has had intermittent encephalopathy, gait abnormality (shuffling gait), and incontinence.  Will place outpatient neurology referral.  Consider MRI prior to discharge.   B12 wnl, folate low, ammonia wnl, TSH elevated PT/OT   Lower Urinary Tract Symptoms Follow CT as above Starting flomax for presumed BPH Follow bladder scans  Needs urology follow up for recurrent UTI's    Incontinence Notes diarrhea as well as incontinence of urine (several months).  Would have low threshold for spine imaging, but no concerning findings on neuro exam, no saddle anesthesia, no back pain, normal rectal tone.  Will keep on radar - consider imaging if concerning symptoms.     Folate Deficiency Replace follow outpatient  Hypomagnesemia  Hypophosphatemia  Replace and follow   T2DM Lower basal insulin  SSI Hold metformin, actos , semaglutide  GERD PPI   Hypertension Lower dose metoprolol  Holding losartan , imdur  for now  CAD Aspirin , statin   Chronic Pain Gabapentin  Richard Ruiz   Hypothyroidism Synthroid  - dose increased with elevated TSH     DVT prophylaxis: lovenox  Code Status: full Family Communication: none Disposition:   Status is: Inpatient Remains inpatient appropriate because: need for continued inpatient care   Consultants:  none  Procedures:  none  Antimicrobials:  Anti-infectives (From admission, onward)    Start     Dose/Rate Route Frequency Ordered Stop   03/24/24 0200  cefTRIAXone  (ROCEPHIN ) 1 g in sodium chloride  0.9 % 100 mL IVPB  Status:  Discontinued        1 g 200 mL/hr over 30 Minutes Intravenous Every 24 hours 03/23/24 1858 03/23/24 1859   03/23/24 2200  cefTRIAXone  (ROCEPHIN ) 1 g in sodium chloride  0.9 % 100 mL IVPB       Note to Pharmacy: Retime as needed, thanks   1 g 200 mL/hr over 30 Minutes Intravenous Every 24 hours 03/23/24 1809     03/23/24 0145  cefTRIAXone  (ROCEPHIN ) 1 g in sodium chloride  0.9 % 100 mL IVPB  1 g 200 mL/hr over 30 Minutes Intravenous  Once 03/23/24 0133 03/23/24 0223   03/23/24 0145  fluconazole  (DIFLUCAN ) tablet 150 mg        150 mg Oral  Once 03/23/24 0137 03/23/24 0225       Subjective: No complaints this AM  Objective: Vitals:   03/23/24 2036 03/23/24 2327 03/24/24 0414 03/24/24 0818  BP: (!) 142/83 (!) 142/87 116/72 136/77  Pulse: 79 87 68 61  Resp:  18 18 18   Temp:  98 F (36.7  C) 98.3 F (36.8 C) 99.2 F (37.3 C)  TempSrc:      SpO2: 96% 98% 95% 95%  Weight:      Height:        Intake/Output Summary (Last 24 hours) at 03/24/2024 1256 Last data filed at 03/24/2024 9355 Gross per 24 hour  Intake 101.03 ml  Output 550 ml  Net -448.97 ml   Filed Weights   03/22/24 2158  Weight: 90.3 kg    Examination:  General exam: Appears calm and comfortable  Respiratory system: unlabored Cardiovascular system: RRR Gastrointestinal system: Abdomen is nondistended, soft and nontender Rectal exam with normal rectal tone, no prostatic tenderness (chaperoned by Richard Sar LPN) Central nervous system: Alert and oriented. No focal neurological deficits. Extremities: no LEE    Data Reviewed: I have personally reviewed following labs and imaging studies  CBC: Recent Labs  Lab 03/22/24 2205 03/23/24 0140 03/23/24 1753 03/23/24 2024 03/24/24 0112  WBC 15.6*  --  9.4 10.3 8.6  NEUTROABS  --   --  6.7  --  6.3  HGB 15.7 16.0 13.9 15.0 13.7  HCT 45.3 47.0 39.9 42.8 39.8  MCV 90.4  --  91.9 93.2 91.5  PLT 375  --  279 282 276    Basic Metabolic Panel: Recent Labs  Lab 03/22/24 2205 03/23/24 0140 03/23/24 1753 03/23/24 2024 03/24/24 0112  NA 134* 133* 135  --  134*  K 4.2 4.1 4.1  --  3.8  CL 95*  --  98  --  100  CO2 24  --  25  --  25  GLUCOSE 213*  --  251*  --  214*  BUN 15  --  14  --  14  CREATININE 1.32*  --  1.22 1.22 1.18  CALCIUM  9.7  --  9.1  --  9.0  MG  --   --   --   --  1.2*  PHOS  --   --   --   --  2.2*    GFR: Estimated Creatinine Clearance: 56 mL/min (by C-G formula based on SCr of 1.18 mg/dL).  Liver Function Tests: Recent Labs  Lab 03/22/24 2205 03/23/24 1753 03/24/24 0112  AST 33 22 22  ALT 24 18 18   ALKPHOS 95 63 57  BILITOT 1.1 1.5* 1.1  PROT 7.5 6.3* 6.5  ALBUMIN 4.2 3.0* 3.0*    CBG: Recent Labs  Lab 03/23/24 0119 03/23/24 2027 03/24/24 0819 03/24/24 1149  GLUCAP 284* 207* 232* 252*     Recent  Results (from the past 240 hours)  Urine Culture     Status: None   Collection Time: 03/22/24 11:39 PM   Specimen: Urine, Clean Catch  Result Value Ref Range Status   Specimen Description   Final    URINE, CLEAN CATCH Performed at [facility], [address], [place], [place] [address]    Special Requests   Final  NONE Performed at [facility], [address] [place], [place] [address]    Culture   Final    NO GROWTH Performed at [facility], [address] [place], [place] [address]    Report Status 03/24/2024 FINAL  Final  Culture, blood (routine x 2)     Status: None (Preliminary result)   Collection Time: 03/23/24  4:10 AM   Specimen: BLOOD RIGHT ARM  Result Value Ref Range Status   Specimen Description   Final    BLOOD RIGHT ARM Performed at [facility], [address] [place], [place] [address]    Special Requests   Final    BOTTLES DRAWN AEROBIC AND ANAEROBIC Blood Culture adequate volume Performed at [facility], [address] [place], [place] [address]    Culture   Final    NO GROWTH < 24 HOURS Performed at [facility], [address], [place], [place] [address]    Report Status PENDING  Incomplete  Culture, blood (routine x 2)     Status: None (Preliminary result)   Collection Time: 03/23/24  4:15 AM   Specimen: BLOOD RIGHT HAND  Result Value Ref Range Status   Specimen Description   Final    BLOOD RIGHT HAND Performed at [facility], [address] [place], [place] [address]    Special Requests   Final    BOTTLES DRAWN AEROBIC AND ANAEROBIC Blood Culture results may not be optimal due to an inadequate volume of blood received in culture bottles Performed at [facility], [address] [place], [place] [address]    Culture   Final    NO GROWTH < 24 HOURS Performed at [facility], [address], [place], [place] [address]    Report Status PENDING   Incomplete         Radiology Studies: CT ABDOMEN PELVIS W CONTRAST Result Date: 03/23/2024 CLINICAL DATA:  Sepsis, UTI, weakness EXAM: CT ABDOMEN AND PELVIS WITH CONTRAST TECHNIQUE: Multidetector CT imaging of the abdomen and pelvis was performed using the standard protocol following bolus administration of intravenous contrast. RADIATION DOSE REDUCTION: This exam was performed according to the departmental dose-optimization program which includes automated exposure control, adjustment of the mA and/or kV according to patient size and/or use of iterative reconstruction technique. CONTRAST:  75mL OMNIPAQUE IOHEXOL 350 MG/ML SOLN COMPARISON:  None Available. FINDINGS: Lower chest: No acute abnormality. Hepatobiliary: Cholelithiasis without evidence of acute cholecystitis. Hepatic steatosis. No biliary dilation. Pancreas: Fatty atrophy.  No acute abnormality. Spleen: Unremarkable. Adrenals/Urinary Tract: Normal adrenal glands. Low-attenuation lesions in the kidneys are statistically likely to represent cysts. No follow-up is required. Punctate nonobstructing left nephrolithiasis. No hydronephrosis. Question urothelial thickening of the both ureters. Nonspecific bilateral perinephric stranding. Vague geographic hypoattenuation in both kidneys compatible with pyelonephritis. Diffuse irregular bladder wall thickening. Multiple small bladder diverticula. Adjacent perivesical stranding. Stomach/Bowel: Normal caliber large and small bowel. Stomach and appendix are within normal limits. Vascular/Lymphatic: Aortic atherosclerosis. No enlarged abdominal or pelvic lymph nodes. Reproductive: No acute abnormality.  Penile prosthesis. Other: Hernia repair of the anterior abdominal wall. No free intraperitoneal fluid or gas. Musculoskeletal: No acute fracture. IMPRESSION: 1. Cystitis and bilateral pyelonephritis. 2. Punctate nonobstructing left nephrolithiasis. 3. Hepatic steatosis. 4. Cholelithiasis without evidence of  acute cholecystitis. 5. Aortic Atherosclerosis (ICD10-I70.0). Electronically Signed   By: Norman Gatlin M.D.   On: 03/23/2024 21:29  DG Chest Port 1 View Result Date: 03/23/2024 CLINICAL DATA:  Weakness, altered mental status, hypoxia EXAM: PORTABLE CHEST 1 VIEW COMPARISON:  03/03/2024 FINDINGS: Low lung volumes accentuate cardiomediastinal silhouette and pulmonary vascularity. Left basilar atelectasis. The lungs are otherwise clear. No pleural effusion or pneumothorax. IMPRESSION: Low lung volumes with left basilar atelectasis. Electronically Signed   By: Norman Gatlin M.D.   On: 03/23/2024 02:39        Scheduled Meds:  aspirin  EC  81 mg Oral Daily   atorvastatin   80 mg Oral QHS   DULoxetine   90 mg Oral Daily   enoxaparin  (LOVENOX ) injection  40 mg Subcutaneous Q24H   folic acid  1 mg Oral Daily   gabapentin   300 mg Oral TID   insulin  aspart  0-15 Units Subcutaneous TID WC   insulin  aspart  0-5 Units Subcutaneous QHS   insulin  glargine-yfgn  15 Units Subcutaneous QHS   [START ON 03/25/2024] levothyroxine   125 mcg Oral QAC breakfast   metoprolol  succinate  50 mg Oral Daily   pantoprazole   40 mg Oral Daily   tamsulosin  0.4 mg Oral QPC supper   Continuous Infusions:  cefTRIAXone  (ROCEPHIN )  IV Stopped (03/23/24 2214)     LOS: 1 day    Time spent: over 30 min    Meliton Monte, MD [facility]   To contact the attending provider between 7A-7P or the covering provider during after hours 7P-7A, please log into the web site [email or web address] and access using universal [place] password for that web site. If you do not have the password, please call the hospital operator.  03/24/2024, 12:56 PM

## 2024-03-25 ENCOUNTER — Other Ambulatory Visit (HOSPITAL_COMMUNITY): Payer: Self-pay

## 2024-03-25 DIAGNOSIS — N3000 Acute cystitis without hematuria: Secondary | ICD-10-CM | POA: Diagnosis not present

## 2024-03-25 LAB — CBC
HCT: 40.7 % (ref 39.0–52.0)
Hemoglobin: 14.3 g/dL (ref 13.0–17.0)
MCH: 32.4 pg (ref 26.0–34.0)
MCHC: 35.1 g/dL (ref 30.0–36.0)
MCV: 92.1 fL (ref 80.0–100.0)
Platelets: 346 10*3/uL (ref 150–400)
RBC: 4.42 MIL/uL (ref 4.22–5.81)
RDW: 12.9 % (ref 11.5–15.5)
WBC: 8.1 10*3/uL (ref 4.0–10.5)
nRBC: 0 % (ref 0.0–0.2)

## 2024-03-25 LAB — GLUCOSE, CAPILLARY: Glucose-Capillary: 157 mg/dL — ABNORMAL HIGH (ref 70–99)

## 2024-03-25 LAB — PHOSPHORUS: Phosphorus: 4.3 mg/dL (ref 2.5–4.6)

## 2024-03-25 LAB — MAGNESIUM: Magnesium: 2.1 mg/dL (ref 1.7–2.4)

## 2024-03-25 MED ORDER — SULFAMETHOXAZOLE-TRIMETHOPRIM 800-160 MG PO TABS
1.0000 | ORAL_TABLET | Freq: Two times a day (BID) | ORAL | 0 refills | Status: DC
  Filled 2024-03-25: qty 10, 5d supply, fill #0

## 2024-03-25 MED ORDER — SULFAMETHOXAZOLE-TRIMETHOPRIM 800-160 MG PO TABS
1.0000 | ORAL_TABLET | Freq: Two times a day (BID) | ORAL | 0 refills | Status: AC
  Filled 2024-03-25: qty 8, 4d supply, fill #0

## 2024-03-25 MED ORDER — DULOXETINE HCL 30 MG PO CPEP
90.0000 mg | ORAL_CAPSULE | Freq: Every morning | ORAL | Status: AC
Start: 2024-03-25 — End: ?

## 2024-03-25 MED ORDER — FOLIC ACID 1 MG PO TABS
1.0000 mg | ORAL_TABLET | Freq: Every day | ORAL | 1 refills | Status: AC
  Filled 2024-03-25: qty 30, 30d supply, fill #0

## 2024-03-25 MED ORDER — LEVOTHYROXINE SODIUM 50 MCG PO TABS
125.0000 ug | ORAL_TABLET | Freq: Every day | ORAL | 1 refills | Status: AC
  Filled 2024-03-25: qty 75, 30d supply, fill #0

## 2024-03-25 MED ORDER — TAMSULOSIN HCL 0.4 MG PO CAPS
0.4000 mg | ORAL_CAPSULE | Freq: Every day | ORAL | 1 refills | Status: AC
  Filled 2024-03-25: qty 30, 30d supply, fill #0

## 2024-03-25 MED ORDER — INSULIN GLARGINE 100 UNITS/ML SOLOSTAR PEN: 30.0000 [IU] | PEN_INJECTOR | Freq: Every day | SUBCUTANEOUS | Status: AC

## 2024-03-25 NOTE — Evaluation (Signed)
 Occupational Therapy Evaluation Patient Details Name: Richard Ruiz MRN: [id number] DOB: 1946/04/29 Today's Date: 03/25/2024   History of Present Illness   Pt is a 78 y/o male presenting 03/22/24 with AMS and weakness. Found to have acute cystitis,  UTI. PMH: HTN, HLD, DM, neuropathy, agent orange exposure in [name], hypothyroidism, CAD, BPH, fatty liver, OSA, PTSD, migraines, cataract, recurrent UTI's     Clinical Impressions Pt ind at baseline with ADLs and used cane for functional mobility, lives with spouse. Pt currently needing up to mod A for ADLs, CGA for bed mobility and CGA for transfers with RW. Pt with posterior lean in bed when attempting to don/doff socks needing assist to correct. Pt presenting with impairments listed below, will follow acutely. Recommend HHOT at d/c.      If plan is discharge home, recommend the following:   A little help with walking and/or transfers;A little help with bathing/dressing/bathroom;Assistance with cooking/housework     Functional Status Assessment   Patient has had a recent decline in their functional status and demonstrates the ability to make significant improvements in function in a reasonable and predictable amount of time.     Equipment Recommendations   None recommended by OT     Recommendations for Other Services   PT consult     Precautions/Restrictions   Precautions Precautions: Fall Recall of Precautions/Restrictions: Intact Precaution/Restrictions Comments: has had many falls, fell right before this admission scraping knee Restrictions Weight Bearing Restrictions Per Provider Order: No     Mobility Bed Mobility Overal bed mobility: Needs Assistance Bed Mobility: Supine to Sit     Supine to sit: Contact guard          Transfers Overall transfer level: Needs assistance Equipment used: Rolling walker (2 wheels) Transfers: Sit to/from Stand Sit to Stand: Contact guard assist                   Balance Overall balance assessment: Needs assistance Sitting-balance support: No upper extremity supported, Feet supported Sitting balance-Leahy Scale: Fair     Standing balance support: Bilateral upper extremity supported, During functional activity Standing balance-Leahy Scale: Poor                             ADL either performed or assessed with clinical judgement   ADL Overall ADL's : Needs assistance/impaired Eating/Feeding: Set up;Sitting   Grooming: Oral care;Wash/dry face;Set up;Standing   Upper Body Bathing: Minimal assistance;Standing;Sitting   Lower Body Bathing: Moderate assistance;Sitting/lateral leans;Sit to/from stand   Upper Body Dressing : Minimal assistance;Sitting;Standing   Lower Body Dressing: Moderate assistance;Sit to/from stand;Sitting/lateral leans   Toilet Transfer: Contact guard assist;Ambulation;Rolling walker (2 wheels)   Toileting- Clothing Manipulation and Hygiene: Supervision/safety       Functional mobility during ADLs: Contact guard assist;Rolling walker (2 wheels)       Vision Baseline Vision/History: 1 Wears glasses Vision Assessment?: No apparent visual deficits     Perception Perception: Not tested       Praxis Praxis: Not tested       Pertinent Vitals/Pain Pain Assessment Pain Assessment: No/denies pain     Extremity/Trunk Assessment Upper Extremity Assessment Upper Extremity Assessment: Overall WFL for tasks assessed   Lower Extremity Assessment Lower Extremity Assessment: Defer to PT evaluation   Cervical / Trunk Assessment Cervical / Trunk Assessment: Normal   Communication Communication Communication: No apparent difficulties   Cognition Arousal: Alert Behavior During Therapy: [facility] for tasks assessed/performed  Cognition: No apparent impairments                               Following commands: Intact Following commands impaired: Follows multi-step commands with increased  time     Cueing  General Comments   Cueing Techniques: Verbal cues  VSS on RA   Exercises     Shoulder Instructions      Home Living Family/patient expects to be discharged to:: Private residence Living Arrangements: Spouse/significant other Available Help at Discharge: Family;Available 24 hours/day Type of Home: House Home Access: Stairs to enter [employer] of Steps: 2 Entrance Stairs-Rails: Right;Left;Can reach both Home Layout: One level     Bathroom Shower/Tub: Producer, television/film/video: Standard     Home Equipment: Rollator (4 wheels);Cane - single Information systems manager (2 wheels)          Prior Functioning/Environment Prior Level of Function : Independent/Modified Independent;History of Falls (last six months);Needs assist;Driving             Mobility Comments: uses cane mostly for mobility, hx of falls due to difficulty lifting feet. uses rollator sometimes out in community (leaving [employer], etc). Was driving before last admission ADLs Comments: Indep with ADLs, shares cleaning with wife (laundry, etc)    OT Problem List: Decreased strength;Decreased activity tolerance;Impaired balance (sitting and/or standing);Decreased knowledge of use of DME or AE   OT Treatment/Interventions: Self-care/ADL training;Therapeutic exercise;Energy conservation;DME and/or AE instruction;Therapeutic activities;Patient/family education;Balance training      OT Goals(Current goals can be found in the care plan section)   Acute Rehab OT Goals Patient Stated Goal: none stated OT Goal Formulation: With patient Time For Goal Achievement: 04/08/24 Potential to Achieve Goals: Good ADL Goals Pt Will Perform Upper Body Dressing: with modified independence;sitting Pt Will Perform Lower Body Dressing: with modified independence;sitting/lateral leans;sit to/from stand Pt Will Transfer to Toilet: with modified independence;ambulating;regular  height toilet Pt Will Perform Tub/Shower Transfer: Shower transfer;with modified independence;ambulating   OT Frequency:  Min 2X/week    Co-evaluation              AM-PAC OT 6 Clicks Daily Activity     Outcome Measure Help from another person eating meals?: None Help from another person taking care of personal grooming?: A Little Help from another person toileting, which includes using toliet, bedpan, or urinal?: A Little Help from another person bathing (including washing, rinsing, drying)?: A Lot Help from another person to put on and taking off regular upper body clothing?: A Little Help from another person to put on and taking off regular lower body clothing?: A Lot 6 Click Score: 17   End of Session Equipment Utilized During Treatment: Gait belt;Rolling walker (2 wheels) Nurse Communication: Mobility status  Activity Tolerance: Patient tolerated treatment well Patient left: in chair;with call bell/phone within reach;with chair alarm set  OT Visit Diagnosis: Unsteadiness on feet (R26.81);Other abnormalities of gait and mobility (R26.89);Muscle weakness (generalized) (M62.81);History of falling (Z91.81)                Time: 9043-8982 OT Time Calculation (min): 21 min Charges:  OT General Charges $OT Visit: 1 Visit OT Evaluation $OT Eval Low Complexity: 1 Low  Rashell Shambaugh K, OTD, OTR/L [employer] [facility]erred Acute Rehab [phone]   Kyrin Garn K Koonce 03/25/2024, 10:50 AM

## 2024-03-25 NOTE — Progress Notes (Signed)
 DISCHARGE NOTE HOME Richard Ruiz to be discharged Home per MD order. Discussed prescriptions and follow up appointments with the patient. Prescriptions given to patient; medication list explained in detail. Patient verbalized understanding.  Skin clean, dry and intact without evidence of skin break down, no evidence of skin tears noted. IV catheter discontinued intact. Site without signs and symptoms of complications. Dressing and pressure applied. Pt denies pain at the site currently. No complaints noted.  Patient free of lines, drains, and wounds.   An After Visit Summary (AVS) was printed and given to the patient. Patient escorted via wheelchair, and discharged home via private auto.  Peyton SHAUNNA Pepper, RN

## 2024-03-25 NOTE — Plan of Care (Signed)

## 2024-03-25 NOTE — Discharge Summary (Signed)
 Physician Discharge Summary  Richard Ruiz FMW:[id number] DOB: 03/09/1946 DOA: 03/23/2024  PCP: [facility]  Admit date: 03/23/2024 Discharge date: 03/25/2024  Time spent: 40 minutes  Recommendations for Outpatient Follow-up:  Follow outpatient CBC/CMP  Follow with PCP outpatient Follow with urology outpatient for frequent UTI's since March Follow with neurology for head CT with prominent lateral ventricles, gait abnormality, confusion, incontinence - consider NPH workup Follow TSH in 6 weeks (increased to 125 mcg) Follow folate outpatient   Discharge Diagnoses:  Principal Problem:   Acute cystitis without hematuria Active Problems:   UTI (urinary tract infection)   Discharge Condition: stable  Diet recommendation: heart healthy, diabetic  Filed Weights   03/22/24 2158  Weight: 90.3 kg    History of present illness:   Richard Ruiz is Richard Ruiz 78 y.o. male who is Richard Ruiz Richard Ruiz with hx Agent Orange exposure with medical history significant of coronary artery disease, diabetes, hypothyroidism, depression, and multiple other medical issues here after developing gait abnormalities, feeling of being off balance and confused.  Symptoms are similar to prior UTI's he's had.   He's improved with antibiotics.  Urine culture was negative.  CT showed cystitis and pyelo.  Will discharge with abx to complete Richard Ruiz course for possible UTI.  Needs follow up with urology for recurrent UTI.  Needs neurology follow up for gait abnormality, confusion, incontinence - r/o NPH.  Hospital Course:  Assessment and Plan:  Pyuria  Suspected Urinary Tract Infection  Leukocytosis Recurrent for 6 months, sounds like 3 episodes?  This is Richard Ruiz new trend for him since March. Unclear what is predisposing him to UTI's?  No definitive UTI with positive culture noted in our records notably.   Reportedly had urine culture with candida glabrata in March? Not able to see these records.   S/p ceftriaxone ,  will discharge with bactrim UA with few bacteria, budding yeast, 6-10 RBC, >50 WBC, small LE  Urine culture no growth (abx given before culture collected notably) Blood cultures NGx2 CT with bilateral pyelo and cystitis - interestingly, he had no CVA tenderness   Acute Metabolic Encephalopathy  Gait Abnormality Related to above Still seems mildly confused, but improved.  Not to his baseline.  No focal deficits on exam.   He had head CT on 6/8 with prominent lateral ventricles -> I think NPH should be considered in this gentleman who has had intermittent encephalopathy, gait abnormality (shuffling gait), and incontinence.  Will place outpatient neurology referral.  B12 wnl, folate low, ammonia wnl, TSH elevated PT/OT   Lower Urinary Tract Symptoms Follow CT as above Starting flomax for presumed BPH Follow bladder scans  Needs urology follow up for recurrent UTI's   Incontinence Notes diarrhea as well as incontinence of urine (several months).  Would have low threshold for spine imaging, but no concerning findings on neuro exam, no saddle anesthesia, no back pain, normal rectal tone.  Image if concerning findings in future.   Folate Deficiency Replace follow outpatient   Hypomagnesemia  Hypophosphatemia  Replace and follow    T2DM Resume home meds  GERD PPI   Hypertension Metorolol, losartan , imdur   CAD Aspirin , statin   Chronic Pain Gabapentin  Cymbalta   Hypothyroidism Synthroid  - dose increased with elevated TSH    Procedures: none   Consultations: none  Discharge Exam: Vitals:   03/25/24 0441 03/25/24 0810  BP: 120/76 133/81  Pulse: (!) 52 (!) 56  Resp: 18 18  Temp: 97.8 F (36.6 C) (!) 97.5 F (36.4  C)  SpO2: 97% 95%   No complaints Feels back to himself Wife thinks he's improved  General: No acute distress. Cardiovascular: RRR Lungs: unlabored Neurological: Alert and oriented 3. Moves all extremities 4 with equal strength. Cranial nerves  II through XII grossly intact. Extremities: No clubbing or cyanosis. No edema.   Discharge Instructions   Discharge Instructions     Ambulatory referral to Neurology   Complete by: As directed    An appointment is requested in approximately: 2 weeks   Call MD for:  difficulty breathing, headache or visual disturbances   Complete by: As directed    Call MD for:  extreme fatigue   Complete by: As directed    Call MD for:  hives   Complete by: As directed    Call MD for:  persistant dizziness or light-headedness   Complete by: As directed    Call MD for:  persistant nausea and vomiting   Complete by: As directed    Call MD for:  redness, tenderness, or signs of infection (pain, swelling, redness, odor or green/yellow discharge around incision site)   Complete by: As directed    Call MD for:  severe uncontrolled pain   Complete by: As directed    Call MD for:  temperature >100.4   Complete by: As directed    Diet - low sodium heart healthy   Complete by: As directed    Discharge instructions   Complete by: As directed    You were seen for Richard Ruiz urinary tract infection.  Your CT showed evidence of infection of the bladder and the kidneys.  Your urine culture is negative, this maybe Richard Ruiz result of receiving antibiotics before the culture was obtained.  We'll send you home with [name] plan to take 4 more days of antibiotics.  You should see urology for your frequent UTI's.  They can help look into your risk factors for frequent urinary tract infections.  We've started you on flomax in case your risk factor is an enlarged prostate.  You mentioned intermittent confusion, gait abnormalities, and urinary incontinence.  Your CT scan in early June showed mildly prominent lateral ventricles.  I've referred you to neurology to consider whether you need additional workup for normal pressure hydrocephalus.  They should call you (but if you don't hear from them, call them).  Your thyroid  hormone is  underdosed.  We've increased to 125 mcg daily.  Follow with your PCP for repeat labs in about 6 weeks.    Your folate is low, we'll send you with supplementation.  Return for new, recurrent, or worsening symptoms.  Please ask your PCP to request records from this hospitalization so they know what was done and what the next steps will be.   Increase activity slowly   Complete by: As directed       Allergies as of 03/25/2024       Reactions   Codeine Itching   Gabapentin  Other (See Comments)   Jittery   Lisinopril Cough        Medication List     TAKE these medications    acetaminophen  500 MG tablet Commonly known as: TYLENOL  Take 1,000 mg by mouth every 6 (six) hours as needed for mild pain (pain score 1-3) or moderate pain (pain score 4-6).   aspirin  EC 81 MG tablet Take 81 mg by mouth in the morning.   atorvastatin  80 MG tablet Commonly known as: LIPITOR Take 80 mg by mouth at bedtime.  cetirizine 10 MG tablet Commonly known as: ZYRTEC Take 10 mg by mouth daily.   cholecalciferol 25 MCG (1000 UNIT) tablet Commonly known as: VITAMIN D3 Take 3,000 Units by mouth daily.   cyanocobalamin  1000 MCG tablet Commonly known as: VITAMIN B12 Take 1 tablet (1,000 mcg total) by mouth daily.   DULoxetine  30 MG capsule Commonly known as: CYMBALTA  Take 3 capsules (90 mg total) by mouth in the morning.   folic acid 1 MG tablet Commonly known as: FOLVITE Take 1 tablet (1 mg total) by mouth daily.   FreeStyle Libre 3 [employer] Use as directed   gabapentin  300 MG capsule Commonly known as: NEURONTIN  Take 300 mg by mouth 3 (three) times daily.   insulin  glargine 100 unit/mL Sopn Commonly known as: LANTUS  Inject 30 Units into the skin daily. What changed: how much to take   isosorbide  mononitrate 60 MG 24 hr tablet Commonly known as: IMDUR  Take 60 mg by mouth in the morning.   levothyroxine  50 MCG tablet Commonly known as: SYNTHROID  Take 2.5 tablets (125 mcg  total) by mouth daily before breakfast. What changed:  medication strength how much to take   losartan  100 MG tablet Commonly known as: COZAAR  Take 100 mg by mouth daily.   metFORMIN 1000 MG tablet Commonly known as: GLUCOPHAGE Take 1,000 mg by mouth 2 (two) times daily.   metoprolol  succinate 25 MG 24 hr tablet Commonly known as: TOPROL -XL Take 12.5 mg by mouth daily.   multivitamin tablet Take 1 tablet by mouth daily.   nitroGLYCERIN  0.4 MG SL tablet Commonly known as: NITROSTAT  Place 0.4 mg under the tongue every 5 (five) minutes x 3 doses as needed for chest pain.   omeprazole 40 MG capsule Commonly known as: PRILOSEC Take 40 mg by mouth at bedtime.   Ozempic (2 MG/DOSE) 8 MG/3ML Sopn Generic drug: Semaglutide (2 MG/DOSE) Inject 2 mg into the skin once Keara Pagliarulo week.   pioglitazone  30 MG tablet Commonly known as: ACTOS  Take 30 mg by mouth in the morning.   PRESCRIPTION MEDICATION Take 1 capsule by mouth daily. Bifantis   Sodium Fluoride 5000 PPM 1.1 % Pste Generic drug: Sodium Fluoride Place 1 application  onto teeth daily.   sulfamethoxazole-trimethoprim 800-160 MG tablet Commonly known as: Bactrim DS Take 1 tablet by mouth 2 (two) times daily for 5 days.   tamsulosin 0.4 MG Caps capsule Commonly known as: FLOMAX Take 1 capsule (0.4 mg total) by mouth daily after supper.       Allergies  Allergen Reactions   Codeine Itching   Gabapentin  Other (See Comments)    Jittery   Lisinopril Cough    Follow-up Information     [facility] Follow [facility].   Specialty: General Practice Why: call for Suhayla Chisom follow up appointment Contact information: [address] [place] [address] [phone]         [facility] Follow up.   Why: I've referred you to neurology for your cognitive issues and gait abnormalities.  I think you should be evaluated and the diagnosis of NPH considered.  You should get Tayla Panozzo phone call, if you don't hear from neurology within Cassey Bacigalupo  week or so, give them Siboney Requejo call. Contact information: [address] [place], [address] [place]  [address] [phone]        [facility] Follow up.   Why: Call [facility] for an appointment for your urinary symptoms and your recurrent UTI's Contact information: [address] [place]  [address] [phone]        Alvaro Ricardo KATHEE Mickey., MD Follow up.   Specialty: Urology Why: You have an appointment on July 22nd at 9:30 am. Contact information: [address] [place] [address] [phone]                  The results of significant diagnostics from this hospitalization (including imaging, microbiology, ancillary and laboratory) are listed below for reference.    Significant Diagnostic Studies: CT ABDOMEN PELVIS W CONTRAST Result Date: 03/23/2024 CLINICAL DATA:  Sepsis, UTI, weakness EXAM: CT ABDOMEN AND PELVIS WITH CONTRAST TECHNIQUE: Multidetector CT imaging of the abdomen and pelvis was performed using the standard protocol following bolus administration of intravenous contrast. RADIATION DOSE REDUCTION: This exam was performed according to the departmental dose-optimization program which includes automated exposure control, adjustment of the mA and/or kV according to patient size and/or use of iterative reconstruction technique. CONTRAST:  75mL OMNIPAQUE IOHEXOL 350 MG/ML SOLN COMPARISON:  None Available. FINDINGS: Lower chest: No acute abnormality. Hepatobiliary: Cholelithiasis without evidence of acute cholecystitis. Hepatic steatosis. No biliary dilation. Pancreas: Fatty atrophy.  No acute abnormality. Spleen: Unremarkable. Adrenals/Urinary Tract: Normal adrenal glands. Low-attenuation lesions in the kidneys are statistically likely to represent cysts. No follow-up is required. Punctate nonobstructing left nephrolithiasis. No hydronephrosis. Question urothelial thickening of the both ureters. Nonspecific bilateral  perinephric stranding. Vague geographic hypoattenuation in both kidneys compatible with pyelonephritis. Diffuse irregular bladder wall thickening. Multiple small bladder diverticula. Adjacent perivesical stranding. Stomach/Bowel: Normal caliber large and small bowel. Stomach and appendix are within normal limits. Vascular/Lymphatic: Aortic atherosclerosis. No enlarged abdominal or pelvic lymph nodes. Reproductive: No acute abnormality.  Penile prosthesis. Other: Hernia repair of the anterior abdominal wall. No free intraperitoneal fluid or gas. Musculoskeletal: No acute fracture. IMPRESSION: 1. Cystitis and bilateral pyelonephritis. 2. Punctate nonobstructing left nephrolithiasis. 3. Hepatic steatosis. 4. Cholelithiasis without evidence of acute cholecystitis. 5. Aortic Atherosclerosis (ICD10-I70.0). Electronically Signed   By: Norman Gatlin M.D.   On: 03/23/2024 21:29   DG Chest Port 1 View Result Date: 03/23/2024 CLINICAL DATA:  Weakness, altered mental status, hypoxia EXAM: PORTABLE CHEST 1 VIEW COMPARISON:  03/03/2024 FINDINGS: Low lung volumes accentuate cardiomediastinal silhouette and pulmonary vascularity. Left basilar atelectasis. The lungs are otherwise clear. No pleural effusion or pneumothorax. IMPRESSION: Low lung volumes with left basilar atelectasis. Electronically Signed   By: Norman Gatlin M.D.   On: 03/23/2024 02:39   DG Chest Port 1 View Result Date: 03/03/2024 CLINICAL DATA:  Fall several hours ago, initial encounter EXAM: PORTABLE CHEST 1 VIEW COMPARISON:  09/16/2013 FINDINGS: Cardiac shadow is within normal limits. Lungs are clear bilaterally. No bony abnormality is seen. Postsurgical changes in the lower neck are noted. IMPRESSION: No active disease. Electronically Signed   By: Oneil Devonshire M.D.   On: 03/03/2024 00:46   CT Head Wo Contrast Result Date: 03/03/2024 CLINICAL DATA:  Inability to speak or walk. EXAM: CT HEAD WITHOUT CONTRAST TECHNIQUE: Contiguous axial images were  obtained from the base of the skull through the vertex without intravenous contrast. RADIATION DOSE REDUCTION: This exam was performed according to the departmental dose-optimization program which includes automated exposure control, adjustment of the mA and/or kV according to patient size and/or use of iterative reconstruction technique. COMPARISON:  None Available. FINDINGS: Brain: No evidence of acute infarction, hemorrhage, extra-axial collection or mass lesion/mass effect. Mild prominence of the lateral ventricles is noted. Vascular: No hyperdense vessel or unexpected calcification. Skull: Normal.  Negative for fracture or focal lesion. Sinuses/Orbits: No acute finding. Other: None. IMPRESSION: Mildly prominent lateral ventricles. No other focal abnormality is noted. Electronically Signed   By: Oneil Devonshire M.D.   On: 03/03/2024 00:39    Microbiology: Recent Results (from the past 240 hours)  Urine Culture     Status: None   Collection Time: 03/22/24 11:39 PM   Specimen: Urine, Clean Catch  Result Value Ref Range Status   Specimen Description   Final    URINE, CLEAN CATCH Performed at [facility], [address] [place], [place] [address]    Special Requests   Final    NONE Performed at [facility], [address] [place], [place] [address]    Culture   Final    NO GROWTH Performed at [facility], [address] [place], [place] [address]    Report Status 03/24/2024 FINAL  Final  Culture, blood (routine x 2)     Status: None (Preliminary result)   Collection Time: 03/23/24  4:10 AM   Specimen: BLOOD RIGHT ARM  Result Value Ref Range Status   Specimen Description   Final    BLOOD RIGHT ARM Performed at [facility], [address] [place], [place] [address]    Special Requests   Final    BOTTLES DRAWN AEROBIC AND ANAEROBIC Blood Culture adequate volume Performed at [facility], [address] [place], [place] [address]     Culture   Final    NO GROWTH 2 DAYS Performed at [facility], [address] [place], [place] [address]    Report Status PENDING  Incomplete  Culture, blood (routine x 2)     Status: None (Preliminary result)   Collection Time: 03/23/24  4:15 AM   Specimen: BLOOD RIGHT HAND  Result Value Ref Range Status   Specimen Description   Final    BLOOD RIGHT HAND Performed at [facility], [address] [place], [place] [address]    Special Requests   Final    BOTTLES DRAWN AEROBIC AND ANAEROBIC Blood Culture results may not be optimal due to an inadequate volume of blood received in culture bottles Performed at [facility], [address] [place], [place] [address]    Culture   Final    NO GROWTH 2 DAYS Performed at [facility], [address], [place], [place] [address]    Report Status PENDING  Incomplete     Labs: Basic Metabolic Panel: Recent Labs  Lab 03/22/24 2205 03/23/24 0140 03/23/24 1753 03/23/24 2024 03/24/24 0112 03/24/24 1326 03/25/24 0338  NA 134* 133* 135  --  134* 133*  --   K 4.2 4.1 4.1  --  3.8 3.9  --   CL 95*  --  98  --  100 99  --   CO2 24  --  25  --  25 25  --   GLUCOSE 213*  --  251*  --  214* 202*  --   BUN 15  --  14  --  14 13  --   CREATININE 1.32*  --  1.22 1.22 1.18 1.05  --   CALCIUM  9.7  --  9.1  --  9.0 9.1  --   MG  --   --   --   --  1.2*  --  2.1  PHOS  --   --   --   --  2.2*  --  4.3   Liver Function Tests: Recent Labs  Lab 03/22/24 2205 03/23/24 1753 03/24/24 0112  AST 33 22 22  ALT 24 18 18   ALKPHOS 95 63 57  BILITOT 1.1 1.5* 1.1  PROT 7.5 6.3* 6.5  ALBUMIN 4.2 3.0* 3.0*   No results for input(s): LIPASE, AMYLASE in the last 168 hours. Recent Labs  Lab 03/24/24 0112  AMMONIA 14   CBC: Recent Labs  Lab 03/22/24 2205 03/23/24 0140 03/23/24 1753 03/23/24 2024 03/24/24 0112 03/25/24 0338  WBC 15.6*  --  9.4 10.3 8.6 8.1  NEUTROABS  --   --  6.7  --  6.3  --   HGB 15.7  16.0 13.9 15.0 13.7 14.3  HCT 45.3 47.0 39.9 42.8 39.8 40.7  MCV 90.4  --  91.9 93.2 91.5 92.1  PLT 375  --  279 282 276 346   Cardiac Enzymes: Recent Labs  Lab 03/22/24 2205  CKTOTAL 325   BNP: BNP (last 3 results) Recent Labs    03/04/24 0554 03/05/24 0525 03/06/24 0544  BNP 160.3* 123.3* 89.1    ProBNP (last 3 results) Recent Labs    03/22/24 2217  PROBNP 229.0    CBG: Recent Labs  Lab 03/24/24 0819 03/24/24 1149 03/24/24 1648 03/24/24 2026 03/25/24 0811  GLUCAP 232* 252* 157* 190* 157*       Signed:  Meliton Monte MD.  [facility] 03/25/2024, 11:22 AM

## 2024-03-25 NOTE — Progress Notes (Signed)
 Transition of Care [facility]) - Inpatient Brief Assessment   Patient Details  Name: Richard Ruiz MRN: [id number] Date of Birth: Jan 18, 1946  [facility] CM/SW Contact:    Rosaline JONELLE Joe, RN Phone Number: 03/25/2024, 11:36 AM   Clinical Narrative: CM met with the patient and wife at the bedside to discuss TOC needs.  The patient lives at home with the wife and plans to return home when medically stable.  Patient is already active with [facility] for PT.  New orders for PT/OT placed and co-sign by the MD. I called [facility] and CM is aware.  I called [facility] and [facility] number was called and [facility] to the hospital - ref # [id number].  Patient will discharge home with wife when medically stable for discharge.   Transition of Care Asessment: Insurance and Status: (P) Insurance coverage has been reviewed Patient has primary care physician: (P) Yes Home environment has been reviewed: (P) from home with spouse Prior level of function:: (P) Hotel manager Home Services: (P) Current home services (Patient is active with [facility] for PT - PT/OT orders for [facility] added) Social Drivers of Health Review: (P) SDOH reviewed interventions complete Readmission risk has been reviewed: (P) Yes Transition of care needs: (P) transition of care needs identified, TOC will continue to follow

## 2024-03-28 LAB — CULTURE, BLOOD (ROUTINE X 2)
Culture: NO GROWTH
Culture: NO GROWTH
Special Requests: ADEQUATE
